# Patient Record
Sex: Female | Born: 1945 | Race: White | Hispanic: No | Smoking: Never smoker
Health system: Southern US, Community
[De-identification: ages and names within clinical notes are randomized; demographics above are authoritative.]

## PROBLEM LIST (undated history)

## (undated) DIAGNOSIS — K589 Irritable bowel syndrome without diarrhea: Secondary | ICD-10-CM

## (undated) DIAGNOSIS — R209 Unspecified disturbances of skin sensation: Principal | ICD-10-CM

## (undated) DIAGNOSIS — F32A Depression, unspecified: Secondary | ICD-10-CM

## (undated) DIAGNOSIS — G5600 Carpal tunnel syndrome, unspecified upper limb: Secondary | ICD-10-CM

## (undated) DIAGNOSIS — F419 Anxiety disorder, unspecified: Secondary | ICD-10-CM

## (undated) DIAGNOSIS — F329 Major depressive disorder, single episode, unspecified: Secondary | ICD-10-CM

## (undated) DIAGNOSIS — E7800 Pure hypercholesterolemia, unspecified: Secondary | ICD-10-CM

## (undated) DIAGNOSIS — G4700 Insomnia, unspecified: Secondary | ICD-10-CM

## (undated) HISTORY — DX: Major depressive disorder, single episode, unspecified: F32.9

## (undated) HISTORY — DX: Depression, unspecified: F32.A

## (undated) HISTORY — DX: Unspecified disturbances of skin sensation: R20.9

## (undated) HISTORY — DX: Irritable bowel syndrome, unspecified: K58.9

## (undated) HISTORY — DX: Insomnia, unspecified: G47.00

## (undated) HISTORY — DX: Pure hypercholesterolemia, unspecified: E78.00

## (undated) HISTORY — DX: Carpal tunnel syndrome, unspecified upper limb: G56.00

## (undated) HISTORY — PX: CARPAL TUNNEL RELEASE: SHX101

## (undated) HISTORY — DX: Anxiety disorder, unspecified: F41.9

---

## 1972-11-13 HISTORY — PX: TUBAL LIGATION: SHX77

## 2000-04-06 ENCOUNTER — Ambulatory Visit (HOSPITAL_COMMUNITY): Admission: RE | Admit: 2000-04-06 | Discharge: 2000-04-06 | Payer: Self-pay | Admitting: Gastroenterology

## 2002-06-02 ENCOUNTER — Encounter: Admission: RE | Admit: 2002-06-02 | Discharge: 2002-06-02 | Payer: Self-pay | Admitting: Family Medicine

## 2002-06-02 ENCOUNTER — Encounter: Payer: Self-pay | Admitting: Family Medicine

## 2003-12-11 ENCOUNTER — Ambulatory Visit (HOSPITAL_COMMUNITY): Admission: RE | Admit: 2003-12-11 | Discharge: 2003-12-11 | Payer: Self-pay | Admitting: *Deleted

## 2003-12-11 ENCOUNTER — Ambulatory Visit (HOSPITAL_BASED_OUTPATIENT_CLINIC_OR_DEPARTMENT_OTHER): Admission: RE | Admit: 2003-12-11 | Discharge: 2003-12-11 | Payer: Self-pay | Admitting: *Deleted

## 2005-02-21 ENCOUNTER — Other Ambulatory Visit: Admission: RE | Admit: 2005-02-21 | Discharge: 2005-02-21 | Payer: Self-pay | Admitting: Family Medicine

## 2006-08-31 ENCOUNTER — Other Ambulatory Visit: Admission: RE | Admit: 2006-08-31 | Discharge: 2006-08-31 | Payer: Self-pay | Admitting: Family Medicine

## 2009-04-09 ENCOUNTER — Other Ambulatory Visit: Admission: RE | Admit: 2009-04-09 | Discharge: 2009-04-09 | Payer: Self-pay | Admitting: Family Medicine

## 2011-03-31 NOTE — Procedures (Signed)
[place]. [facility]  Patient:    Courtney House, Courtney House                        MRN: [id number] Proc. Date: 04/06/00 Adm. Date:  [id number] Disc. Date: [id number] Attending:  Louie Bun CC:         Gretta Arab Valentina Lucks, M.D.                           Procedure Report  INDICATION FOR PROCEDURE:  Family history of colon cancer in her first degree family relative.  DESCRIPTION OF PROCEDURE:  The patient was placed in the left lateral decubitus position and placed on the pulse monitor with continuous low-flow oxygen delivered by nasal cannula.  She was sedated with 80 mg IV Demerol and 6 mg IV Versed.  The Olympus video colonoscope was inserted into the rectum and advanced to the cecum, confirmed by transillumination of McBurneys point and visualization of the ileocecal valve and appendiceal orifice.  The prep was excellent.  The cecum, ascending, transverse, descending, and sigmoid colon all appeared normal with no masses, polyps, diverticula, or other mucosal abnormalities.  The rectum likewise appeared normal in retroflexed view.  The anus did reveal some small internal hemorrhoids.  The colonoscope was then withdrawn and the patient returned to the recovery room in stable condition.  She tolerated the procedure well and there were no immediate complications.  IMPRESSION:  Internal hemorrhoids, otherwise normal colonoscopy.  PLAN:  Repeat colonoscopy in five years based on her family history. DD:  02/05/00 TD:  04/11/00 Job: [id number]

## 2011-03-31 NOTE — Op Note (Signed)
Courtney House, Courtney House                           ACCOUNT NO.:  [id number]   MEDICAL RECORD NO.:  [id number]                   PATIENT TYPE:  AMB   LOCATION:  DSC                                  FACILITY:  [facility]   PHYSICIAN:  Lowell Bouton, M.D.      DATE OF BIRTH:  1946-05-03   DATE OF PROCEDURE:  12/11/2003  DATE OF DISCHARGE:                                 OPERATIVE REPORT   PREOPERATIVE DIAGNOSIS:  Right carpal tunnel syndrome.   POSTOPERATIVE DIAGNOSIS:  Right carpal tunnel syndrome.   PROCEDURE:  Decompression median nerve right carpal tunnel.   SURGEON:  Lowell Bouton, M.D.   ANESTHESIA:  0.5% Marcaine local with sedation.   OPERATIVE FINDINGS:  The patient had a very narrow carpal canal.  The motor  branch of the nerve was intact and there were no masses present.   PROCEDURE:  Under 0.5% Marcaine local anesthesia with a tourniquet on the  right arm, the right hand was prepped and draped in the usual fashion.  After exsanguinating the limb, the tourniquet was inflated to 250 mmHg.  A 3  cm longitudinal incision was made in the palm just ulnar to the thenar  crease.  Sharp dissection was carried to the subcutaneous tissues.  Blunt  dissection was carried through the superficial palmar fascia distal to the  transverse carpal ligament.  A hemostat was placed in the carpal canal up  against the hook of the hamate and the transverse carpal ligament was  divided along the ulnar border of the median nerve.  The proximal end of the  ligament was divided with the scissors after dissecting the nerve away from  the under surface of the ligament.  The carpal canal was then palpated and  was found to be adequately decompressed.  The nerve was examined and the  motor branch was identified.  The wound was then irrigated with saline.  The  skin was closed with 4-0 nylon sutures.  Sterile dressings were applied  followed by a volar wrist splint.  The patient tolerated  the procedure well  and went to the recovery room awake, stable, in good condition.                                               Lowell Bouton, M.D.    EMM/MEDQ  D:  12/11/2003  T:  12/11/2003  Job:  [id number]   cc:   L. Lupe Carney, M.D.  [address] [place]  [place] [address]  Fax: [phone]

## 2013-07-15 ENCOUNTER — Other Ambulatory Visit: Payer: Self-pay

## 2013-07-15 DIAGNOSIS — Z1231 Encounter for screening mammogram for malignant neoplasm of breast: Secondary | ICD-10-CM

## 2013-08-04 ENCOUNTER — Ambulatory Visit: Payer: Self-pay

## 2013-10-02 ENCOUNTER — Other Ambulatory Visit: Payer: Self-pay | Admitting: Family Medicine

## 2013-10-02 ENCOUNTER — Other Ambulatory Visit (HOSPITAL_COMMUNITY)
Admission: RE | Admit: 2013-10-02 | Discharge: 2013-10-02 | Disposition: A | Discharge status: Home / Self Care | Payer: BC Managed Care – PPO | Source: Ambulatory Visit | Attending: Family Medicine | Admitting: Family Medicine

## 2013-10-02 DIAGNOSIS — Z124 Encounter for screening for malignant neoplasm of cervix: Secondary | ICD-10-CM | POA: Insufficient documentation

## 2013-10-23 ENCOUNTER — Other Ambulatory Visit: Payer: Self-pay | Admitting: Obstetrics & Gynecology

## 2014-05-04 ENCOUNTER — Encounter: Payer: Self-pay | Admitting: Neurology

## 2014-05-05 ENCOUNTER — Ambulatory Visit (INDEPENDENT_AMBULATORY_CARE_PROVIDER_SITE_OTHER): Payer: BC Managed Care – PPO | Admitting: Neurology

## 2014-05-05 ENCOUNTER — Encounter (INDEPENDENT_AMBULATORY_CARE_PROVIDER_SITE_OTHER): Payer: Self-pay

## 2014-05-05 ENCOUNTER — Encounter: Payer: Self-pay | Admitting: Neurology

## 2014-05-05 VITALS — BP 143/76 | HR 69 | Resp 16 | Ht 65.5 in | Wt 155.0 lb

## 2014-05-05 DIAGNOSIS — G609 Hereditary and idiopathic neuropathy, unspecified: Secondary | ICD-10-CM

## 2014-05-05 DIAGNOSIS — R209 Unspecified disturbances of skin sensation: Secondary | ICD-10-CM

## 2014-05-05 HISTORY — DX: Unspecified disturbances of skin sensation: R20.9

## 2014-05-05 MED ORDER — NONFORMULARY OR COMPOUNDED ITEM: Status: DC

## 2014-05-05 MED ORDER — GABAPENTIN 300 MG PO CAPS: 300.0000 mg | ORAL_CAPSULE | Freq: Every day | ORAL | Status: DC

## 2014-05-05 NOTE — Patient Instructions (Signed)
Peripheral Neuropathy Peripheral neuropathy is a type of nerve damage. It affects nerves that carry signals between the spinal cord and other parts of the body. These are called peripheral nerves. With peripheral neuropathy, one nerve or a group of nerves may be damaged.  CAUSES  Many things can damage peripheral nerves. For some people with peripheral neuropathy, the cause is unknown. Some causes include:  Diabetes. This is the most common cause of peripheral neuropathy.  Injury to a nerve.  Pressure or stress on a nerve that lasts a long time.  Too little vitamin B. Alcoholism can lead to this.  Infections.  Autoimmune diseases, such as multiple sclerosis and systemic lupus erythematosus.  Inherited nerve diseases.  Some medicines, such as cancer drugs.  Toxic substances, such as lead and mercury.  Too little blood flowing to the legs.  Kidney disease.  Thyroid disease. SIGNS AND SYMPTOMS  Different people have different symptoms. The symptoms you have will depend on which of your nerves is damaged. Common symptoms include:  Loss of feeling (numbness) in the feet and hands.  Tingling in the feet and hands.  Pain that burns.  Very sensitive skin.  Weakness.  Not being able to move a part of the body (paralysis).  Muscle twitching.  Clumsiness or poor coordination.  Loss of balance.  Not being able to control your bladder.  Feeling dizzy.  Sexual problems. DIAGNOSIS  Peripheral neuropathy is a symptom, not a disease. Finding the cause of peripheral neuropathy can be hard. To figure that out, your health care  will take a medical history and do a physical exam. A neurological exam will also be done. This involves checking things affected by your brain, spinal cord, and nerves (nervous system). For example, your health care  will check your reflexes, how you move, and what you can feel.  Other types of tests may also be ordered, such as:  Blood  tests.  A test of the fluid in your spinal cord.  Imaging tests, such as CT scans or an MRI.  Electromyography (EMG). This test checks the nerves that control muscles.  Nerve conduction velocity tests. These tests check how fast messages pass through your nerves.  Nerve biopsy. A small piece of nerve is removed. It is then checked under a microscope. TREATMENT   Medicine is often used to treat peripheral neuropathy. Medicines may include:  Pain-relieving medicines. Prescription or over-the-counter medicine may be suggested.  Antiseizure medicine. This may be used for pain.  Antidepressants. These also may help ease pain from neuropathy.  Lidocaine. This is a numbing medicine. You might wear a patch or be given a shot.  Mexiletine. This medicine is typically used to help control irregular heart rhythms.  Surgery. Surgery may be needed to relieve pressure on a nerve or to destroy a nerve that is causing pain.  Physical therapy to help movement.  Assistive devices to help movement. HOME CARE INSTRUCTIONS   Only take over-the-counter or prescription medicines as directed by your health care . Follow the instructions carefully for any given medicines. Do not take any other medicines without first getting approval from your health care .  If you have diabetes, work closely with your health care  to keep your blood sugar under control.  If you have numbness in your feet:  Check every day for signs of injury or infection. Watch for redness, warmth, and swelling.  Wear padded socks and comfortable shoes. These help protect your feet.  Do not do   things that put pressure on your damaged nerve.  Do not smoke. Smoking keeps blood from getting to damaged nerves.  Avoid or limit alcohol. Too much alcohol can cause a lack of B vitamins. These vitamins are needed for healthy nerves.  Develop a good support system. Coping with peripheral neuropathy can be  stressful. Talk to a mental health specialist or join a support group if you are struggling.  Follow up with your health care  as directed. SEEK MEDICAL CARE IF:   You have new signs or symptoms of peripheral neuropathy.  You are struggling emotionally from dealing with peripheral neuropathy.  You have a fever. SEEK IMMEDIATE MEDICAL CARE IF:   You have an injury or infection that is not healing.  You feel very dizzy or begin vomiting.  You have chest pain.  You have trouble breathing. Document Released: 10/20/2002 Document Revised: 07/12/2011 Document Reviewed: 07/07/2013 ExitCare Patient Information 2015 [employer]. This information is not intended to replace advice given to you by your health care . Make sure you discuss any questions you have with your health care .  

## 2014-05-05 NOTE — Progress Notes (Signed)
[facility]  :  Courtney Novasarmen  House, M D  Referring : Courtney House, Dean, MD Primary Care Physician:  Courtney House,DEAN, MD  Chief Complaint  Patient presents with  . New Evaluation    Room 11  . Leg pain and burning    HPI:  Courtney House is a 68 y.o. caucasian, right handed female , who is seen here as a referral from Dr. Lupe Carneyean Mitchell for abnormal sensations in her legs.   Dear Dr. August Saucerean,  Thank you for referring your patient Ms. Nelda SevereMary House to me today.  Her primary concern  as outlined in your referral note is a painful , ongoing , abnormal sensation in both legs-  but mostly in her left lower extremity.  She states that she has a sense of burning and stinging , aggravated by periods of prolonged standing. She also endorsed a creepy crawly sensation under the skin. She endorsed that she feels that the dysesthesias are coming from deep in size and not just from the skin layer itself. She works 11 hours daily she reports 6 days a week in standing position. She is in manufactoring, assembly line work , Journalist, newspaperbuilding  gas pumps.  The symptoms started some a couple of years ago, did not respond to OTC NSAIDS , such as ibuprofen.  Now progressing to more frequent times and with the same intensity as 3 years ago. She has not found relief. On days when she doesn't work , her symptoms begin as soon as she leaves the bed and begins ambulating. The symptoms are not nearly as strong as on work days.  There is no associated edema ,no cyanosis, no cramps - but loots of broken veins in the lower extremities.  She has no known metabolic disorder (I reviewed the labs form 2012 through 2014).  Dr Clovis RileyMitchell treats this patient with Tramadol, Temazepam and Lorazepam- these medications will not be prescribed by [facility].          Review of Systems: Out of a complete 14 system review, the patient complains of only the following symptoms, and all other reviewed systems are  negative. Burning, stinging pain in lower extremities, bilaterally but mostly on the left;   History   Social History  . Marital Status: Widowed    Spouse Name: N/A    Number of Children: 2  . Years of Education: 12   Occupational History  . Not on file.   Social History Main Topics  . Smoking status: Never Smoker   . Smokeless tobacco: Never Used  . Alcohol Use: No  . Drug Use: No  . Sexual Activity: Not on file   Other Topics Concern  . Not on file   Social History Narrative   Patient is widowed and her daughter and grandson lives with her.   Patient does drink one glass of tea daily.   Patient works on an  Theatre stage managerassembly line.   Patient has a high school education.   Patient has two adult children.   Patient is right-handed.                      Family History  Problem Relation Age of Onset  . Breast cancer Sister     lymph nodes  . Parkinson's disease Brother   . Throat cancer Brother   . Macular degeneration Brother     Past Medical History  Diagnosis Date  . IBS (irritable bowel syndrome)   . Insomnia   .  Hypercholesterolemia   . Carpal tunnel syndrome   . Anxiety   . Depression   . Sensory disease or syndrome 05/05/2014    Past Surgical History  Procedure Laterality Date  . Tubal ligation  1974  . Carpal tunnel release Right     Current Outpatient Prescriptions  Medication Sig Dispense Refill  . diclofenac (VOLTAREN) 75 MG EC tablet 1 tablet 2 (two) times daily.      . hyoscyamine (LEVSIN, ANASPAZ) 0.125 MG tablet 1 tablet. Every four hours as needed for abdominal pain.      Marland Kitchen. LORazepam (ATIVAN) 0.5 MG tablet Take by mouth. 1/2 -1 tablet twice a day as needed for anxiety      . temazepam (RESTORIL) 15 MG capsule Take 15 mg by mouth at bedtime as needed for sleep.      . TraMADol HCl (RYBIX ODT) 50 MG TBDP 1 tablet. Every 4 hours as needed for abdominal pain       No current facility-administered medications for this visit.    Allergies as of  05/05/2014  . (Not on File)    Vitals: BP 143/76  Pulse 69  Resp 16  Ht 5' 5.5" (1.664 m)  Wt 155 lb (70.308 kg)  BMI 25.39 kg/m2 Last Weight:  Wt Readings from Last 1 Encounters:  05/05/14 155 lb (70.308 kg)   Last Height:   Ht Readings from Last 1 Encounters:  05/05/14 5' 5.5" (1.664 m)    Physical exam:  General: The patient is awake, alert and appears not in acute distress. The patient is well groomed. Head: Normocephalic, atraumatic. Neck is supple. Mallampati 3, neck circumference:15  Cardiovascular:  Regular rate and rhythm , without  murmurs or carotid bruit, and without distended neck veins. Respiratory: Lungs are clear to auscultation. Skin:  Without evidence of edema, or rash, there is evidence of dystrophic skin over the lower extremities, the patient has no longer hair growth on the leg, has shiny and paleish skin with multiple  burst vessels.  Trunk: BMI is  elevated and patient  has normal posture.  Neurologic exam : The patient is awake and alert, oriented to place and time.  Memory subjective  described as intact. There is a normal attention span & concentration ability.  Speech is fluent without   dysarthria, dysphonia or aphasia. Mood and affect are appropriate.  Cranial nerves: Pupils are equal and briskly reactive to light. Funduscopic exam without  evidence of pallor or edema. Extraocular movements in vertical and horizontal planes intact and without nystagmus. Visual fields by finger perimetry are intact. Hearing to finger rub intact.  Facial sensation intact to fine touch. Facial motor strength is symmetric and tongue and uvula move midline.  Motor exam:    Normal tone and  muscle bulk and symmetric normal strength in all extremities. She has no pain with straight leg rise, no radicula-opathy  Sensory:  Fine touch, pinprick and vibration were tested in all extremities, no loss of vibration, temperature or fine filamen touch. Proprioception  in the upper  extremities  normal.  Coordination: Rapid alternating movements in the fingers/hands is tested and normal. Finger-to-nose maneuver tested and normal without evidence of ataxia, dysmetria or tremor.  Gait and station: Patient walks without assistive device . She has flat feet.  Strength within normal limits. Stance is stable and normal. Tandem gait is unaffected, as is Romberg test.   Deep tendon reflexes: in the  upper and lower extremities are attenuated. Babinski maneuver response is sluggish,  Downgoing, upon repeat testing. .   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  Dystrophy of the skin area. Loss of sweat glands and hair roots  Plan:  Treatment plan and additional workup : Skin biopsy referral to Dr. Terrace Arabia.And arrange for Dr. Terrace Arabia to decide if an evaluation by NCS /EMG .  this patient has no thyroid disease , no diabetes and no other endocrine disorder.  I would like for her to use a topical pain cream rather than tramadol, write for 6a by [facility]der[facility].  Insoles with foam / gel cushion to get better pain prevention.

## 2014-05-11 ENCOUNTER — Telehealth: Payer: Self-pay

## 2014-05-11 NOTE — Telephone Encounter (Signed)
Called patient and left her a message to call me back and schedule Skin Biopsy with Dr.Yan.

## 2014-05-13 ENCOUNTER — Telehealth: Payer: Self-pay

## 2014-05-13 MED ORDER — GABAPENTIN 100 MG PO CAPS: 100.0000 mg | ORAL_CAPSULE | Freq: Every day | ORAL | Status: DC

## 2014-05-13 NOTE — Telephone Encounter (Signed)
Yes, she can use 100 to 300 mg as needed.

## 2014-05-13 NOTE — Telephone Encounter (Signed)
[facility] sent us a fax saying the patient feels like Gabapentin has helped with her pain, but she is very groggy in the morning.  She would like to know if the Rx could be changed from 300mg  capsules to 100mg  capsules with directions of take 1-3 caps po as needed.  Okay to change Rx?  Please advise.  Thank you.

## 2014-05-18 NOTE — Telephone Encounter (Signed)
Called patient and she is coming for skin biopsy with Dr.Yan 05/19/2014. Dr.Yan had a cx. appt.

## 2014-05-18 NOTE — Telephone Encounter (Signed)
Patient returning call to Arma HeadingDana C, please call patient and advise.

## 2014-05-19 ENCOUNTER — Ambulatory Visit (INDEPENDENT_AMBULATORY_CARE_PROVIDER_SITE_OTHER): Payer: Self-pay | Admitting: Neurology

## 2014-05-19 ENCOUNTER — Encounter: Payer: Self-pay | Admitting: Neurology

## 2014-05-19 ENCOUNTER — Ambulatory Visit (INDEPENDENT_AMBULATORY_CARE_PROVIDER_SITE_OTHER): Payer: BC Managed Care – PPO | Admitting: Neurology

## 2014-05-19 ENCOUNTER — Encounter (INDEPENDENT_AMBULATORY_CARE_PROVIDER_SITE_OTHER): Payer: Self-pay

## 2014-05-19 DIAGNOSIS — Z0289 Encounter for other administrative examinations: Secondary | ICD-10-CM

## 2014-05-19 DIAGNOSIS — R200 Anesthesia of skin: Secondary | ICD-10-CM

## 2014-05-19 DIAGNOSIS — R209 Unspecified disturbances of skin sensation: Secondary | ICD-10-CM

## 2014-05-20 NOTE — Procedures (Signed)
   NCS (NERVE CONDUCTION STUDY) WITH EMG (ELECTROMYOGRAPHY) REPORT   STUDY DATE: July 7th 2015 PATIENT NAME: Courtney House DOB: 12/27/1945 MRN: [id number]    TECHNOLOGIST: Gearldine ShownLorraine Jones ELECTROMYOGRAPHER: Levert FeinsteinYan,  M.D.  CLINICAL INFORMATION:   68 years old female, complains few years history of left leg, foot paresthesia, deep achy pain.  FINDINGS: NERVE CONDUCTION STUDY: Bilateral peroneal sensory responses were normal. Bilateral tibial motor responses were normal. Bilateral tibial H. reflexes were normal and symmetric. Right peroneal motor responses were normal. Left peroneal motor response showed mildly decreased C. map amplitude, with normal distal latency, conduction velocity, and F waves latency.   Bilateral ulnar sensory and motor responses were normal.  Bilateral median sensory response showed prolonged peak latency, left side this severe, right side is moderate with preserved snap amplitude.  Bilateral median motor responses showed prolonged distal latency, F wave latency, left side is moderate, right side is mild, with normal C. map amplitude, conduction velocity    NEEDLE ELECTROMYOGRAPHY: Selected needle examination was performed at left lower extremity muscles, and left lumbosacral paraspinals.  Needle examination of left tibialis anterior, tibialis posterior, medial gastrocnemius, peroneal longus, vastus lateralis, biceps femoris long head, gluteus medius was normal  There is no spontaneous activity at left lumbosacral paraspinal muscles, left L4-5 S1  IMPRESSION:   This is an abnormal study. There is electrodiagnostic evidence of moderate bilateral median neuropathy across the wrist, consistent with moderate bilateral carpal tunnel syndromes. Left side worse than the right.  There was no evidence of left lumbar radiculopathy.   INTERPRETING PHYSICIAN:   Levert FeinsteinYan,  M.D. Ph.D. [facility] [address] [place], [place] [address] [phone]  [phone]

## 2014-05-20 NOTE — Procedures (Signed)
68 years old female, complains few years history of left leg numbness, achy discomfort.  Patient was in right lateral recombinant position. Sterile technique. 1% lidocaine with epinephrine was used for local anesthesia. Punctuated skin biopsy was performed. 3 mm skin sample were obtained at at left foot, above left extensor digitorum brevis, and left lateral calf, 10 cm above lateral malleolus. 20 cm below left superior iliac spine.  Patient tolerated the procedure well.  The wound was covered with neosporin antibiotic cream and bandage.

## 2014-05-22 NOTE — Progress Notes (Signed)
error 

## 2014-05-27 ENCOUNTER — Telehealth: Payer: Self-pay

## 2014-05-27 NOTE — Telephone Encounter (Signed)
Results for skin  Biopsy in your box.

## 2014-05-27 NOTE — Telephone Encounter (Signed)
I tried and failed to reach patient  Please call patient, skin biopsy showed no evidence of peripheral neuropathy, despite the reported reduced fiber density at left thigh, which likely due to technique variant.

## 2014-05-27 NOTE — Telephone Encounter (Signed)
Called pt and left message for pt to call back.

## 2014-05-28 NOTE — Telephone Encounter (Signed)
Pt called back and I informed pt per Dr. Terrace ArabiaYan that the pt's skin biopsy results showed no evidence of peripheral neuropathy, despite the reported reduced fiber density at left thigh, which likely due to technique variant. I advised the pt the if she has any other problems, questions or concerns to call the office. Pt verbalized understanding.

## 2014-06-22 ENCOUNTER — Encounter: Payer: Self-pay | Admitting: Neurology

## 2014-06-26 ENCOUNTER — Ambulatory Visit (INDEPENDENT_AMBULATORY_CARE_PROVIDER_SITE_OTHER): Payer: BC Managed Care – PPO | Admitting: Adult Health

## 2014-06-26 ENCOUNTER — Encounter: Payer: Self-pay | Admitting: Adult Health

## 2014-06-26 VITALS — BP 145/83 | HR 83 | Ht 65.5 in | Wt 155.0 lb

## 2014-06-26 DIAGNOSIS — R209 Unspecified disturbances of skin sensation: Secondary | ICD-10-CM

## 2014-06-26 NOTE — Progress Notes (Signed)
I agree with the assessment and plan as directed by NP .The patient is known to me .   ,, MD  

## 2014-06-26 NOTE — Patient Instructions (Signed)

## 2014-06-26 NOTE — Progress Notes (Signed)
PATIENT: Courtney SaversMary H House DOB: 03/15/1946  REASON FOR VISIT: follow up HISTORY FROM: patient  HISTORY OF PRESENT ILLNESS: Courtney House is a 68 year old female with a history of peripheral neuropathy. She returns today for follow-up. She had skin biopsy that did not show a small fiber neuropathy. She was using a compound cream but states that it made the discomfort worse. She also uses gabapentin at night and reports that it has helped a lot. Patient states that she has burning and tingling in the lower legs but not in the feet and more on the left than right. Denies numbness.Denies any issue with ambulation and balance. Denies any falls.  She had nerve conduction studies with EMG that show bilateral carpal tunnel syndrome. The patient denies having any blood work done except with her PCP.   HISTORY 05/05/14 (CD):  Her primary concern as outlined in your referral note is a painful , ongoing , abnormal sensation in both legs- but mostly in her left lower extremity.  She states that she has a sense of burning and stinging , aggravated by periods of prolonged standing. She also endorsed a creepy crawly sensation under the skin. She endorsed that she feels that the dysesthesias are coming from deep in size and not just from the skin layer itself. She works 11 hours daily she reports 6 days a week in standing position. She is in Set designermanufacturing, assembly line work , Chemical engineerbuilding gas pumps.  The symptoms started some a couple of years ago, did not respond to OTC NSAIDS , such as ibuprofen.  Now progressing to more frequent times and with the same intensity as 3 years ago. She has not found relief. On days when she doesn't work , her symptoms begin as soon as she leaves the bed and begins ambulating. The symptoms are not nearly as strong as on work days.  There is no associated edema ,no cyanosis, no cramps - but loots of broken veins in the lower extremities.  She has no known metabolic disorder (I reviewed the  labs form 2012 through 2014).  Dr Clovis RileyMitchell treats this patient with Tramadol, Temazepam and Lorazepam- these medications will not be prescribed by [facility].    REVIEW OF SYSTEMS: Full 14 system review of systems performed and notable only for:  Constitutional: N/A  Eyes: N/A Ear/Nose/Throat: N/A  Skin: N/A  Cardiovascular: N/A  Respiratory: N/A  Gastrointestinal: N/A  Genitourinary: N/A Hematology/Lymphatic: N/A  Endocrine: N/A Musculoskeletal:N/A  Allergy/Immunology: N/A  Neurological: N/A Psychiatric: N/A Sleep: N/A   ALLERGIES: No Known Allergies  HOME MEDICATIONS: Outpatient Prescriptions Prior to Visit  Medication Sig Dispense Refill  . diclofenac (VOLTAREN) 75 MG EC tablet 1 tablet 2 (two) times daily.      Marland Kitchen. gabapentin (NEURONTIN) 100 MG capsule Take 1 capsule (100 mg total) by mouth at bedtime. Take between 1- 3 caps at night po.  90 capsule  3  . hyoscyamine (LEVSIN, ANASPAZ) 0.125 MG tablet 1 tablet. Every four hours as needed for abdominal pain.      Marland Kitchen. LORazepam (ATIVAN) 0.5 MG tablet Take by mouth. 1/2 -1 tablet twice a day as needed for anxiety      . temazepam (RESTORIL) 15 MG capsule Take 15 mg by mouth at bedtime as needed for sleep.      . TraMADol HCl (RYBIX ODT) 50 MG TBDP 1 tablet. Every 4 hours as needed for abdominal pain      . Gabapentin POWD       .  NONFORMULARY OR COMPOUNDED ITEM Transdermal therapeutics - 6 A ointment for pain in both feet.  100 each  1   No facility-administered medications prior to visit.    PAST MEDICAL HISTORY: Past Medical History  Diagnosis Date  . IBS (irritable bowel syndrome)   . Insomnia   . Hypercholesterolemia   . Carpal tunnel syndrome   . Anxiety   . Depression   . Sensory disease or syndrome 05/05/2014    PAST SURGICAL HISTORY: Past Surgical History  Procedure Laterality Date  . Tubal ligation  1974  . Carpal tunnel release Right     FAMILY HISTORY: Family History  Problem Relation Age of Onset  .  Breast cancer Sister     lymph nodes  . Parkinson's disease Brother   . Throat cancer Brother   . Macular degeneration Brother     SOCIAL HISTORY: History   Social History  . Marital Status: Widowed    Spouse Name: N/A    Number of Children: 2  . Years of Education: 12   Occupational History  . Not on file.   Social History Main Topics  . Smoking status: Never Smoker   . Smokeless tobacco: Never Used  . Alcohol Use: No  . Drug Use: No  . Sexual Activity: Not on file   Other Topics Concern  . Not on file   Social History Narrative   Patient is widowed and her daughter and grandson lives with her.   Patient does drink one glass of tea daily.   Patient works on an  Theatre stage manager.   Patient has a high school education.   Patient has two adult children.   Patient is right-handed.                        PHYSICAL EXAM  Filed Vitals:   06/26/14 1011  BP: 145/83  Pulse: 83  Height: 5' 5.5" (1.664 m)  Weight: 155 lb (70.308 kg)   Body mass index is 25.39 kg/(m^2).  Generalized: Well developed, in no acute distress    Neurological examination  Mentation: Alert oriented to time, place, history taking. Follows all commands speech and language fluent Cranial nerve II-XII: Pupils were equal round reactive to light extraocular movements were full, visual field were full on confrontational test. Facial sensation and strength were normal. hearing was intact to finger rubbing bilaterally. Uvula tongue midline. Head turning and shoulder shrug  were normal and symmetric. Motor: The motor testing reveals 5 over 5 strength of all 4 extremities. Good symmetric motor tone is noted throughout.  Sensory: Sensory testing is intact to pinprick, soft touch, vibration sensation, and position sense on all 4 extremities. No evidence of extinction is noted. Patient does have certain areas on the left lower leg where pinprick felt dull compared to the foot.  Coordination: Cerebellar  testing reveals good finger-nose-finger and heel-to-shin bilaterally.  Gait and station: Gait is normal. Tandem gait is normal. Romberg is negative. No drift is seen.  Reflexes: Deep tendon reflexes are symmetric and normal bilaterally.   DIAGNOSTIC DATA (LABS, IMAGING, TESTING) - I reviewed patient records, labs, notes, testing and imaging myself where available.    ASSESSMENT AND PLAN 68 y.o. year old female  has a past medical history of IBS (irritable bowel syndrome); Insomnia; Hypercholesterolemia; Carpal tunnel syndrome; Anxiety; Depression; and Sensory disease or syndrome (05/05/2014). here with:  1. Burning and tingling in lower extremities.   On exam patient does not have  a clear presentation of a peripheral neuropathy. She has had  nerve conduction studies with EMG that only showed a bilateral carpal tunnel syndrome. She has also had a skin biopsy that did not confirm a small fiber neuropathy. She states that she has  burning and tingling but denies numbness in the lower extremities limited to the lower leg but not the foot. She states that the burning and tingling occur more in the left leg when compared to the right. I will check some blood work today looking for other etiologies of her sensory change. She does state that the gabapentin taking 300 mg at night has been very beneficial for her neuropathic pain. She should continue taking this. Patient should followup in 4 months or sooner if needed   Butch Penny, MSN, NP-C 06/26/2014, 10:15 AM [facility] [address] [place], [place] [address] [phone]  Note: This document was prepared with digital dictation and possible smart phrase technology. Any transcriptional errors that result from this process are unintentional.

## 2014-06-30 ENCOUNTER — Telehealth: Payer: Self-pay | Admitting: Adult Health

## 2014-06-30 LAB — IFE AND PE, SERUM
ALBUMIN/GLOB SERPL: 1.3 (ref 0.7–2.0)
Albumin SerPl Elph-Mcnc: 3.6 g/dL (ref 3.2–5.6)
Alpha 1: 0.2 g/dL (ref 0.1–0.4)
Alpha2 Glob SerPl Elph-Mcnc: 0.7 g/dL (ref 0.4–1.2)
B-Globulin SerPl Elph-Mcnc: 0.9 g/dL (ref 0.6–1.3)
GAMMA GLOB SERPL ELPH-MCNC: 1.1 g/dL (ref 0.5–1.6)
GLOBULIN, TOTAL: 2.8 g/dL (ref 2.0–4.5)
IGA/IMMUNOGLOBULIN A, SERUM: 167 mg/dL (ref 91–414)
IGG (IMMUNOGLOBIN G), SERUM: 1000 mg/dL (ref 700–1600)
IgM (Immunoglobulin M), Srm: 171 mg/dL (ref 40–230)
Total Protein: 6.4 g/dL (ref 6.0–8.5)

## 2014-06-30 LAB — SEDIMENTATION RATE: Sed Rate: 7 mm/hr (ref 0–40)

## 2014-06-30 LAB — RHEUMATOID FACTOR: Rhuematoid fact SerPl-aCnc: <7 IU/mL (ref 0.0–13.9)

## 2014-06-30 LAB — ANA W/REFLEX: ANA: NEGATIVE

## 2014-06-30 LAB — ANGIOTENSIN CONVERTING ENZYME: Angio Convert Enzyme: 50 U/L (ref 14–82)

## 2014-06-30 LAB — VITAMIN B12: Vitamin B-12: 400 pg/mL (ref 211–946)

## 2014-06-30 LAB — LYME, TOTAL AB TEST/REFLEX: Lyme IgG/IgM Ab: <0.91 {ISR} (ref 0.00–0.90)

## 2014-06-30 LAB — TSH: TSH: 0.593 u[IU]/mL (ref 0.450–4.500)

## 2014-06-30 LAB — VITAMIN D 25 HYDROXY (VIT D DEFICIENCY, FRACTURES): Vit D, 25-Hydroxy: 23.3 ng/mL — ABNORMAL LOW (ref 30.0–100.0)

## 2014-06-30 NOTE — Telephone Encounter (Signed)
I called the patient regarding lab results. I left a voicemail and requested that she call the office back.

## 2014-06-30 NOTE — Telephone Encounter (Signed)
I tried to call the patient with lab work. No answer.

## 2014-07-01 ENCOUNTER — Telehealth: Payer: Self-pay | Admitting: Adult Health

## 2014-07-01 NOTE — Telephone Encounter (Signed)
I called the patient again in regard to her lab results. I left a voice mail for her to call our office back.

## 2014-07-02 ENCOUNTER — Encounter: Payer: Self-pay | Admitting: Adult Health

## 2014-07-02 ENCOUNTER — Telehealth: Payer: Self-pay | Admitting: Adult Health

## 2014-07-02 NOTE — Telephone Encounter (Signed)
I called the patient and left a message. I will send her a letter regarding her lab work and plan of care.

## 2014-07-03 ENCOUNTER — Encounter: Payer: Self-pay | Admitting: Adult Health

## 2014-07-03 ENCOUNTER — Telehealth: Payer: Self-pay | Admitting: Adult Health

## 2014-07-03 NOTE — Telephone Encounter (Signed)
error 

## 2014-07-10 ENCOUNTER — Telehealth: Payer: Self-pay | Admitting: Adult Health

## 2014-07-10 NOTE — Telephone Encounter (Signed)
Patient has questions regarding letter received with blood work results.  Please return call anytime and may leave message on vm.

## 2014-07-13 NOTE — Telephone Encounter (Signed)
Called patient back, no answer left message that her Vitamin D levels were low and she is to get OTC Vitamin D 5000 IU supplements, take daily for 8 weeks and levels will be re-checked then, any other questions or concerns to call the office back.

## 2014-08-24 ENCOUNTER — Telehealth: Payer: Self-pay | Admitting: Adult Health

## 2014-08-24 DIAGNOSIS — E559 Vitamin D deficiency, unspecified: Secondary | ICD-10-CM

## 2014-08-24 NOTE — Telephone Encounter (Signed)
I called the patient and left a message that I would like to recheck lab work. The patient needs to have her vitamin D level rechecked.

## 2014-09-10 ENCOUNTER — Other Ambulatory Visit (INDEPENDENT_AMBULATORY_CARE_PROVIDER_SITE_OTHER): Payer: Self-pay

## 2014-09-10 DIAGNOSIS — E559 Vitamin D deficiency, unspecified: Secondary | ICD-10-CM

## 2014-09-10 DIAGNOSIS — Z0289 Encounter for other administrative examinations: Secondary | ICD-10-CM

## 2014-09-11 ENCOUNTER — Other Ambulatory Visit: Payer: Self-pay | Admitting: Adult Health

## 2014-09-11 LAB — VITAMIN D 25 HYDROXY (VIT D DEFICIENCY, FRACTURES): Vit D, 25-Hydroxy: 47 ng/mL (ref 30.0–100.0)

## 2014-11-09 ENCOUNTER — Ambulatory Visit (INDEPENDENT_AMBULATORY_CARE_PROVIDER_SITE_OTHER): Payer: BC Managed Care – PPO | Admitting: Adult Health

## 2014-11-09 ENCOUNTER — Encounter: Payer: Self-pay | Admitting: Adult Health

## 2014-11-09 VITALS — BP 133/82 | HR 88 | Ht 66.0 in | Wt 148.0 lb

## 2014-11-09 DIAGNOSIS — R209 Unspecified disturbances of skin sensation: Secondary | ICD-10-CM

## 2014-11-09 DIAGNOSIS — R208 Other disturbances of skin sensation: Secondary | ICD-10-CM

## 2014-11-09 MED ORDER — GABAPENTIN 100 MG PO CAPS: ORAL_CAPSULE | ORAL | Status: DC

## 2014-11-09 NOTE — Progress Notes (Signed)
I agree with the assessment and plan as directed by NP .The patient is known to me .   ,, MD  

## 2014-11-09 NOTE — Patient Instructions (Signed)
Increase gabapentin to 1 tablet int he morning and 3 tablets at bedtime.  If this makes you drowsy during the day you can take 4 tablets at bedtime.  If your symptoms worsen or you develop new symptoms please let us know.

## 2014-11-09 NOTE — Progress Notes (Signed)
PATIENT: Courtney House DOB: April 24, 1946  REASON FOR VISIT: follow up HISTORY FROM: patient  HISTORY OF PRESENT ILLNESS: Courtney House is a 68 year old female with a history of peripheral neuropathy. She returns today for follow-up. She is currently taking gabapentin 300 mg at bedtime and tolerating it well however she states that it is not working as well as it was. She has numbness and tingling that are in the legs primarily. She states that the left is worse than the right. She states that it bothers her the most when she is up walking or standing. She states that it gets better with rest. She had blood work at the last visit that show a low Vitamin D. Since has been on supplementation and her level increased. Denies any back pain.    HISTORY 06/26/14: Courtney House is a 68 year old female with a history of peripheral neuropathy. She returns today for follow-up. She had skin biopsy that did not show a small fiber neuropathy. She was using a compound cream but states that it made the discomfort worse. She also uses gabapentin at night and reports that it has helped a lot. Patient states that she has burning and tingling in the lower legs but not in the feet and more on the left than right. Denies numbness.Denies any issue with ambulation and balance. Denies any falls.  She had nerve conduction studies with EMG that show bilateral carpal tunnel syndrome. The patient denies having any blood work done except with her PCP.   HISTORY 05/05/14 (CD): Her primary concern as outlined in your referral note is a painful , ongoing , abnormal sensation in both legs- but mostly in her left lower extremity.  She states that she has a sense of burning and stinging , aggravated by periods of prolonged standing. She also endorsed a creepy crawly sensation under the skin. She endorsed that she feels that the dysesthesias are coming from deep in size and not just from the skin layer itself. She works 11 hours daily she reports 6  days a week in standing position. She is in Set designer, assembly line work , Chemical engineer pumps.  The symptoms started some a couple of years ago, did not respond to OTC NSAIDS , such as ibuprofen.  Now progressing to more frequent times and with the same intensity as 3 years ago. She has not found relief. On days when she doesn't work , her symptoms begin as soon as she leaves the bed and begins ambulating. The symptoms are not nearly as strong as on work days. There is no associated edema ,no cyanosis, no cramps - but loots of broken veins in the lower extremities. She has no known metabolic disorder (I reviewed the labs form 2012 through 2014).  [name] treats this patient with Tramadol, Temazepam and Lorazepam- these medications will not be prescribed by [facility].  REVIEW OF SYSTEMS: Out of a complete 14 system review of symptoms, the patient complains only of the following symptoms, and all other reviewed systems are negative.  ALLERGIES: No Known Allergies  HOME MEDICATIONS: Outpatient Prescriptions Prior to Visit  Medication Sig Dispense Refill  . diclofenac (VOLTAREN) 75 MG EC tablet 1 tablet 2 (two) times daily.    Marland Kitchen gabapentin (NEURONTIN) 100 MG capsule Take 1 capsule (100 mg total) by mouth at bedtime. Take between 1- 3 caps at night po. (Patient taking differently: Take 300 mg by mouth at bedtime. Take between 1- 3 caps at night  po.) 90 capsule 3  . LORazepam (ATIVAN) 0.5 MG tablet Take by mouth. 1/2 -1 tablet twice a day as needed for anxiety    . temazepam (RESTORIL) 15 MG capsule Take 15 mg by mouth at bedtime as needed for sleep.    . TraMADol HCl (RYBIX ODT) 50 MG TBDP 1 tablet. Every 4 hours as needed for abdominal pain    . hyoscyamine (LEVSIN, ANASPAZ) 0.125 MG tablet 1 tablet. Every four hours as needed for abdominal pain.     No facility-administered medications prior to visit.    PAST MEDICAL HISTORY: Past Medical History  Diagnosis Date  . IBS (irritable bowel  syndrome)   . Insomnia   . Hypercholesterolemia   . Carpal tunnel syndrome   . Anxiety   . Depression   . Sensory disease or syndrome 05/05/2014    PAST SURGICAL HISTORY: Past Surgical History  Procedure Laterality Date  . Tubal ligation  1974  . Carpal tunnel release Right     FAMILY HISTORY: Family History  Problem Relation Age of Onset  . Breast cancer Sister     lymph nodes  . Parkinson's disease Brother   . Throat cancer Brother   . Macular degeneration Brother     SOCIAL HISTORY: History   Social History  . Marital Status: Widowed    Spouse Name: N/A    Number of Children: 2  . Years of Education: 12   Occupational History  . Not on file.   Social History Main Topics  . Smoking status: Never Smoker   . Smokeless tobacco: Never Used  . Alcohol Use: No  . Drug Use: No  . Sexual Activity: Not on file   Other Topics Concern  . Not on file   Social History Narrative   Patient is widowed and her daughter and grandson lives with her.   Patient does drink one glass of tea daily.   Patient works on an  Theatre stage managerassembly line.   Patient has a high school education.   Patient has two adult children.   Patient is right-handed.                        PHYSICAL EXAM  Filed Vitals:   11/09/14 1020  BP: 133/82  Pulse: 88  Height: 5\' 6"  (1.676 m)  Weight: 148 lb (67.132 kg)   Body mass index is 23.9 kg/(m^2).  Generalized: Well developed, in no acute distress   Neurological examination  Mentation: Alert oriented to time, place, history taking. Follows all commands speech and language fluent Cranial nerve II-XII: Pupils were equal round reactive to light. Extraocular movements were full, visual field were full on confrontational test. Facial sensation and strength were normal. Uvula tongue midline. Head turning and shoulder shrug  were normal and symmetric. Motor: The motor testing reveals 5 over 5 strength of all 4 extremities. Good symmetric motor tone is  noted throughout.  Sensory: Sensory testing is intact to soft touch on all 4 extremities. Pinprick decrease in stocking like pattern bilaterally however worse on the left. Decrease vibration sensation on the left. Position sense intact bilaterally. No evidence of extinction is noted.  Coordination: Cerebellar testing reveals good finger-nose-finger and heel-to-shin bilaterally.  Gait and station: Gait is normal. Tandem gait is normal. Romberg is negative. No drift is seen.  Reflexes: Deep tendon reflexes are symmetric and normal bilaterally.    DIAGNOSTIC DATA (LABS, IMAGING, TESTING) - I reviewed patient records, labs, notes, testing  and imaging myself where available.      Component Value Date/Time   PROT 6.4 06/26/2014 1054    Lab Results  Component Value Date   VITAMINB12 400 06/26/2014   Lab Results  Component Value Date   TSH 0.593 06/26/2014      ASSESSMENT AND PLAN 68 y.o. year old female  has a past medical history of IBS (irritable bowel syndrome); Insomnia; Hypercholesterolemia; Carpal tunnel syndrome; Anxiety; Depression; and Sensory disease or syndrome (05/05/2014). here with:  1. Burning and tingling in lower extremities left greater than right  The patient's discomfort was controlled with gabapentin 300 mg at bedtime. She states recently the pain has increased. The patient states that her symptoms normally occur with activity and improve with rest. I will increase her gabapentin to 100 mg in the a.m. and 300 mg in the PM. The patient has had nerve conduction studies with the EMG that only showed carpal tunnel syndrome. The patient denies any low back pain. In the future a lumbar MRI may be warranted as her symptoms are not completely consistent with peripheral neuropathy. If her symptoms worsen or she develops new symptoms she should let us know. Otherwise she will follow up in 4 months with Dr. Vickey Hugerohmeier.  Butch PennyMegan , MSN, NP-C 11/09/2014, 10:39 AM [facility] [address], [address] [place], [place] [id number] [phone]  Note: This document was prepared with digital dictation and possible smart phrase technology. Any transcriptional errors that result from this process are unintentional.

## 2015-03-02 ENCOUNTER — Other Ambulatory Visit: Payer: Self-pay | Admitting: Adult Health

## 2015-03-10 ENCOUNTER — Encounter: Payer: Self-pay | Admitting: Neurology

## 2015-03-10 ENCOUNTER — Ambulatory Visit (INDEPENDENT_AMBULATORY_CARE_PROVIDER_SITE_OTHER): Payer: BLUE CROSS/BLUE SHIELD | Admitting: Neurology

## 2015-03-10 VITALS — BP 138/80 | HR 76 | Resp 18 | Ht 66.0 in | Wt 144.0 lb

## 2015-03-10 DIAGNOSIS — R609 Edema, unspecified: Secondary | ICD-10-CM | POA: Diagnosis not present

## 2015-03-10 DIAGNOSIS — L942 Calcinosis cutis: Secondary | ICD-10-CM | POA: Diagnosis not present

## 2015-03-10 DIAGNOSIS — R600 Localized edema: Secondary | ICD-10-CM

## 2015-03-10 MED ORDER — GABAPENTIN 100 MG PO CAPS: ORAL_CAPSULE | ORAL | Status: DC

## 2015-03-10 MED ORDER — PRENATAL VITAMINS 28-0.8 MG PO TABS: 28.0000 mg | ORAL_TABLET | ORAL | Status: AC

## 2015-03-10 NOTE — Progress Notes (Signed)
PATIENT: Courtney House DOB: [birth year]  REASON FOR VISIT: follow up HISTORY FROM: patient  HISTORY OF PRESENT ILLNESS: Courtney House is a 69 year old female with a history of peripheral neuropathy. She returns today for follow-up. She is currently taking gabapentin 300 mg at bedtime and tolerating it well however she states that it is not working as well as it was. She will have puffiness around the ankle /.  She has numbness and tingling that are in the legs primarily.  She states that the left is still worse than the right. She states that it bothers her the most when she is up walking or standing, gets better with rest.  She had blood work at the last visit that show a low Vitamin D. Since has been on supplementation and her level increased. Denies any back pain.   She had nerve conduction studies with EMG that show bilateral carpal tunnel syndrome. The patient denies having any blood work done except with her PCP.   HISTORY 05/05/14 (CD): Her primary concern as outlined in your referral note is a painful , ongoing , abnormal sensation in both legs- but mostly in her left lower extremity.  She states that she has a sense of burning and stinging , aggravated by periods of prolonged standing. She also endorsed a creepy crawly sensation under the skin. She endorsed that she feels that the dysesthesias are coming from deep in size and not just from the skin layer itself. She works 11 hours daily she reports 6 days a week in standing position. She is in Set designer, assembly line work , Chemical engineer pumps.  The symptoms started some a couple of years ago, did not respond to OTC NSAIDS , such as ibuprofen.  Now progressing to more frequent times and with the same intensity as 3 years ago. She has not found relief. On days when she doesn't work , her symptoms begin as soon as she leaves the bed and begins ambulating. The symptoms are not nearly as strong as on work days. There is no associated edema ,no  cyanosis, no cramps - but loots of broken veins in the lower extremities. She has no known metabolic disorder (I reviewed the labs form 2012 through 2014).  [name] treats this patient with Tramadol, Temazepam and Lorazepam- these medications will not be prescribed by [facility].   03-10-15 The patient is still taking tramadol and in addition gabapentin as provided by [facility]. She does have shiny puffy skin the left more than the right but this definitely dystrophic skin changes noticed. She is not diabetic, has never been a smoker, and has no history of thyroid disease. 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 The geometric depression score was endorsed at one point. She had no falls as of this calendar year.  REVIEW OF SYSTEMS: Out of a complete 14 system review of symptoms, the patient complains only of the following symptoms, and all other reviewed systems are negative.  Edema in both feet left more than right, skin changes skin dystrophy depigmentation hyperpigmentation and scaling of the skin. Redness. Burst blood vessels.  ALLERGIES: No Known Allergies  HOME MEDICATIONS: Outpatient Prescriptions Prior to Visit  Medication Sig Dispense Refill  . calcium gluconate 650 MG tablet Take 650 mg by mouth daily.    . cholecalciferol (VITAMIN D) 1000 UNITS tablet Take 1,000 Units by mouth daily.    Marland Kitchen gabapentin (NEURONTIN) 100  MG capsule TAKE 1 CAPSULE IN THE MORNING, AND 3 CAPLETS AT BEDTIME. 120 capsule 0  . LORazepam (ATIVAN) 0.5 MG tablet Take by mouth. 1/2 -1 tablet twice a day as needed for anxiety    . temazepam (RESTORIL) 15 MG capsule Take 15 mg by mouth at bedtime as needed for sleep.    . TraMADol HCl (RYBIX ODT) 50 MG TBDP 1 tablet. Every 4 hours as needed for abdominal pain    . diclofenac (VOLTAREN) 75 MG EC tablet 1 tablet 2 (two) times daily.     No facility-administered medications prior to visit.    PAST MEDICAL  HISTORY: Past Medical History  Diagnosis Date  . IBS (irritable bowel syndrome)   . Insomnia   . Hypercholesterolemia   . Carpal tunnel syndrome   . Anxiety   . Depression   . Sensory disease or syndrome 05/05/2014    PAST SURGICAL HISTORY: Past Surgical History  Procedure Laterality Date  . Tubal ligation  1974  . Carpal tunnel release Right     FAMILY HISTORY: Family History  Problem Relation Age of Onset  . Breast cancer Sister     lymph nodes  . Parkinson's disease Brother   . Throat cancer Brother   . Macular degeneration Brother     SOCIAL HISTORY: History   Social History  . Marital Status: Widowed    Spouse Name: N/A  . Number of Children: 2  . Years of Education: 12   Occupational History  . [employer]    Social History Main Topics  . Smoking status: Never Smoker   . Smokeless tobacco: Never Used  . Alcohol Use: No  . Drug Use: No  . Sexual Activity: Not on file   Other Topics Concern  . Not on file   Social History Narrative   Patient is widowed and her daughter and grandson lives with her.   Patient does drink one glass of tea daily.   Patient works on an  Theatre stage manager.   Patient has a high school education.   Patient has two adult children.   Patient is right-handed.                        PHYSICAL EXAM  Filed Vitals:   03/10/15 1547  BP: 138/80  Pulse: 76  Resp: 18  Height:  (1.676 m)  Weight: 144 lb (65.318 kg)   Body mass index is 23.25 kg/(m^2).  Generalized: Well developed, in no acute distress   Neurological examination  Mentation: Alert oriented to time, place, history taking. Follows all commands speech and language fluent Cranial nerve II-XII: Pupils were equal round reactive to light. Extraocular movements were full, visual field were full on confrontational test. Facial sensation and strength were normal. Uvula tongue midline. Head turning and shoulder shrug  were normal and symmetric. Motor: The motor  testing reveals 5 over 5 strength of all 4 extremities. Good symmetric motor tone is noted throughout.  Sensory: Sensory testing is intact to soft touch on all 4 extremities. Pinprick decrease in stocking like pattern bilaterally however worse on the left. Decrease vibration sensation on the left. Position sense intact bilaterally. No evidence of extinction is noted.  Coordination: Cerebellar testing reveals good finger-nose-finger and heel-to-shin bilaterally.  Gait and station: Gait is normal. Tandem gait is normal. Romberg is negative. No drift is seen.  Reflexes: Deep tendon reflexes are symmetric and normal bilaterally.    DIAGNOSTIC DATA (LABS, IMAGING,  TESTING) - I reviewed patient records, labs, notes, testing and imaging myself where available.      Component Value Date/Time   PROT 6.4 06/26/2014 1054    Lab Results  Component Value Date   VITAMINB12 400 06/26/2014   Lab Results  Component Value Date   TSH 0.593 06/26/2014      ASSESSMENT AND PLAN 69 y.o. year old female with neuropathy.   1. Burning and tingling in lower extremities left greater than right, the associated skin changes make me think that this could be a hepatitis or cold agglutinin related skin change. The patient is 69 years old and could well be and the group that has otherwise silent hepatitis.  The patient's discomfort was controlled with gabapentin 300 mg at bedtime. She states recently the pain has increased.  The daytime dose let to more sleepiness.  The patient states that her symptoms normally occur with activity and improve with rest. I will increase her gabapentin to 300 mg in the PM . and 300 mg in the night .  The patient has had nerve conduction studies with the EMG that only showed carpal tunnel syndrome.  The patient denies any low back pain.If her symptoms worsen or she develops new symptoms she should let us know. Ordered hepatitis panel.    follow up in 6 months with me or NP,  Dr.  Vickey Hugerohmeier. I referred to dermatology to evaluate the skin changes and the mole on the neck.   Melvyn Novasarmen , MD   03/10/2015, 3:59 PM [facility] [address], [address] [place], [place] [id number] [phone]

## 2015-03-10 NOTE — Patient Instructions (Signed)
Involuntary weight loss.

## 2015-03-11 ENCOUNTER — Telehealth: Payer: Self-pay | Admitting: Neurology

## 2015-03-11 LAB — SJOGRENS SYNDROME-B EXTRACTABLE NUCLEAR ANTIBODY

## 2015-03-11 LAB — ANA W/REFLEX IF POSITIVE: ANA: NEGATIVE

## 2015-03-11 LAB — HEPATITIS C ANTIBODY (REFLEX)

## 2015-03-11 LAB — SJOGRENS SYNDROME-A EXTRACTABLE NUCLEAR ANTIBODY

## 2015-03-11 LAB — HCV COMMENT:

## 2015-03-11 NOTE — Telephone Encounter (Signed)
I called the pharmacy to clarify.  Spoke with [facility].  He asked that we disregard the call.  Says no change is needed.

## 2015-03-11 NOTE — Telephone Encounter (Signed)
[facility]eko from [facility] [address] [phone]( [phone]) called regarding the patients Georgina Pillion(Mcsweeney, Corrie DandyMary. MRN [id number]) Rx. Prenatal Vit-Fe Fumarate-FA (PRENATAL VITAMINS) 28-0.8 MG TABS Wanting to know if he can change the dosage a little bit for insurance reasons. Please call and advise.

## 2015-03-22 ENCOUNTER — Telehealth: Payer: Self-pay | Admitting: *Deleted

## 2015-03-22 NOTE — Telephone Encounter (Signed)
I called and LMVM for pt to return call about lab results.

## 2015-03-23 NOTE — Telephone Encounter (Signed)
I called pt and lab results given along with result notes.  Consideration of rheumatologist per Dr. Vickey Hugerohmeier and pt was ok to proceed.  [facility] (Dr. Lupe Carneyean Mitchell for [facility]).  Pt was ok with this.

## 2015-03-23 NOTE — Telephone Encounter (Signed)
Patient returned call, please call and advise.

## 2015-03-23 NOTE — Progress Notes (Signed)
Quick Note:  I called Courtney House and relayed the results and consideration of rheumatologist. Courtney House was ok to see. Will defer back to Dr. Lupe Carneyean Mitchell [facility] to refer, Courtney House ok with this. ______

## 2015-03-23 NOTE — Telephone Encounter (Signed)
LMVM for pt to return call again.

## 2015-04-02 ENCOUNTER — Other Ambulatory Visit: Payer: Self-pay | Admitting: Adult Health

## 2015-04-02 NOTE — Telephone Encounter (Signed)
Last OV note says: I will increase her gabapentin to 300 mg in the PM . and 300 mg in the night .

## 2015-05-26 ENCOUNTER — Other Ambulatory Visit: Payer: Self-pay | Admitting: Neurology

## 2015-07-14 ENCOUNTER — Ambulatory Visit (INDEPENDENT_AMBULATORY_CARE_PROVIDER_SITE_OTHER): Payer: BLUE CROSS/BLUE SHIELD | Admitting: Adult Health

## 2015-07-14 ENCOUNTER — Encounter: Payer: Self-pay | Admitting: Adult Health

## 2015-07-14 VITALS — BP 140/75 | HR 76 | Ht 66.0 in | Wt 140.0 lb

## 2015-07-14 DIAGNOSIS — G629 Polyneuropathy, unspecified: Secondary | ICD-10-CM | POA: Diagnosis not present

## 2015-07-14 MED ORDER — GABAPENTIN 300 MG PO CAPS: ORAL_CAPSULE | ORAL | Status: DC

## 2015-07-14 NOTE — Progress Notes (Signed)
PATIENT: Courtney House DOB: 10-26-1946  REASON FOR VISIT: follow up- sensory neuropathy HISTORY FROM: patient  HISTORY OF PRESENT ILLNESS: Mrs. Courtney House is a 69 year old female with a history of sensory neuropathy. She returns today for follow-up. She is currently taken gabapentin. She states that she takes her first dose at 6 PM and then again before bedtime usually around 9 PM. She states this works well for her discomfort and helps her sleep at night. She states that she has no new numbness and tingling that primarily affects the legs not really the feet. She states that her discomfort is worse if she is standing still or sitting with her legs in a dependent position. She states her discomfort is better if she props her feet up or is very active. She denies any changes with her gait or balance. Overall she feels that she is doing well. Denies any new neurological symptoms. At the last visit Dr. Vickey House refer the patient to dermatology. She states that she did see a dermatologist and has been using cream on her arms and legs. She has a follow-up appointment in October with her dermatologist. She returns today for an evaluation.  HISTORY  Ms. Courtney House is a 69 year old female with a history of peripheral neuropathy. She returns today for follow-up. She is currently taking gabapentin 300 mg at bedtime and tolerating it well however she states that it is not working as well as it was. She will have puffiness around the ankle /. She has numbness and tingling that are in the legs primarily. She states that the left is still worse than the right. She states that it bothers her the most when she is up walking or standing, gets better with rest.  She had blood work at the last visit that show a low Vitamin D. Since has been on supplementation and her level increased. Denies any back pain.  She had nerve conduction studies with EM[facility] that show bilateral carpal tunnel syndrome. The patient denies having any  blood work done except with her PCP.   HISTORY 05/05/14 (CD): Her primary concern as outlined in your referral note is a painful , ongoing , abnormal sensation in both legs- but mostly in her left lower extremity. She states that she has a sense of burning and stinging , aggravated by periods of prolonged standing. She also endorsed a creepy crawly sensation under the skin. She endorsed that she feels that the dysesthesias are coming from deep in size and not just from the skin layer itself. She works 11 hours daily she reports 6 days a week in standing position. She is in Set designer, assembly line work , Chemical engineer pumps. The symptoms started some a couple of years ago, did not respond to OTC NSAIDS , such as ibuprofen. Now progressing to more frequent times and with the same intensity as 3 years ago. She has not found relief. On days when she doesn't work , her symptoms begin as soon as she leaves the bed and begins ambulating. The symptoms are not nearly as strong as on work days. There is no associated edema ,no cyanosis, no cramps - but loots of broken veins in the lower extremities. She has no known metabolic disorder (I reviewed the labs form 2012 through 2014). Dr Courtney House treats this patient with Tramadol, Temazepam and Lorazepam- these medications will not be prescribed by [facility].  03-10-15 The patient is still taking tramadol and in addition gabapentin as provided by [facility]. She  does have shiny puffy skin the left more than the right but this definitely dystrophic skin changes noticed. She is not diabetic, has never been a smoker, and has no history of thyroid disease. 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 The geometric depression score was endorsed at one point. She had no falls as of this calendar year.  REVIEW OF SYSTEMS: Out of a complete 14 system review of symptoms, the patient complains only of the following  symptoms, and all other reviewed systems are negative.  See history of present illness  ALLERGIES: No Known Allergies  HOME MEDICATIONS: Outpatient Prescriptions Prior to Visit  Medication Sig Dispense Refill  . calcium gluconate 650 MG tablet Take 650 mg by mouth daily.    . cholecalciferol (VITAMIN D) 1000 UNITS tablet Take 1,000 Units by mouth daily.    Marland Kitchen gabapentin (NEURONTIN) 100 MG capsule Increase gabapentin to 300 mg in the afternoon and 300 mg in the night . 180 capsule 6  . gabapentin (NEURONTIN) 300 MG capsule Take 300 mg in the PM and 300 mg in the night 180 capsule 0  . LORazepam (ATIVAN) 0.5 MG tablet Take by mouth. 1/2 -1 tablet twice a day as needed for anxiety    . Prenatal Vit-Fe Fumarate-FA (PRENATAL VITAMINS) 28-0.8 MG TABS Take 28 mg by mouth 1 day or 1 dose. 30 tablet 5  . temazepam (RESTORIL) 15 MG capsule Take 15 mg by mouth at bedtime as needed for sleep.    . TraMADol HCl (RYBIX ODT) 50 MG TBDP 1 tablet. Every 4 hours as needed for abdominal pain     No facility-administered medications prior to visit.    PAST MEDICAL HISTORY: Past Medical History  Diagnosis Date  . IBS (irritable bowel syndrome)   . Insomnia   . Hypercholesterolemia   . Carpal tunnel syndrome   . Anxiety   . Depression   . Sensory disease or syndrome 05/05/2014    PAST SURGICAL HISTORY: Past Surgical History  Procedure Laterality Date  . Tubal ligation  1974  . Carpal tunnel release Right     FAMILY HISTORY: Family History  Problem Relation Age of Onset  . Breast cancer Sister     lymph nodes  . Parkinson's disease Brother   . Throat cancer Brother   . Macular degeneration Brother     SOCIAL HISTORY: Social History   Social History  . Marital Status: Widowed    Spouse Name: N/A  . Number of Children: 2  . Years of Education: 12   Occupational History  . [employer]    Social History Main Topics  . Smoking status: Never Smoker   . Smokeless tobacco: Never Used  .  Alcohol Use: No  . Drug Use: No  . Sexual Activity: Not on file   Other Topics Concern  . Not on file   Social History Narrative   Patient is widowed and her daughter and grandson lives with her.   Patient does drink one glass of tea daily.   Patient works on an  Theatre stage manager.   Patient has a high school education.   Patient has two adult children.   Patient is right-handed.                        PHYSICAL EXAM  Filed Vitals:   07/14/15 1523  BP: 140/75  Pulse: 76  Height:  (1.676 m)  Weight: 140 lb (63.504 kg)   Body mass index is 22.61 kg/(m^2).  Generalized: Well developed, in no acute distress   Neurological examination  Mentation: Alert oriented to time, place, history taking. Follows all commands speech and language fluent Cranial nerve II-XII: Pupils were equal round reactive to light. Extraocular movements were full, visual field were full on confrontational test. Facial sensation and strength were normal. Uvula tongue midline. Head turning and shoulder shrug  were normal and symmetric. Motor: The motor testing reveals 5 over 5 strength of all 4 extremities. Good symmetric motor tone is noted throughout.  Sensory: Sensory testing is intact to soft touch and vibration sensation on all 4 extremities. Pinprick is decreased in the lower extremities. No evidence of extinction is noted.  Coordination: Cerebellar testing reveals good finger-nose-finger and heel-to-shin bilaterally.  Gait and station: Gait is normal. Tandem gait is normal. Romberg is negative. No drift is seen.  Reflexes: Deep tendon reflexes are symmetric and normal bilaterally.   DIAGNOSTIC DATA (LABS, IMAGING, TESTING) - I reviewed patient records, labs, notes, testing and imaging myself where available.      ASSESSMENT AND PLAN 69 y.o. year old female  has a past medical history of IBS (irritable bowel syndrome); Insomnia; Hypercholesterolemia; Carpal tunnel syndrome; Anxiety;  Depression; and Sensory disease or syndrome (05/05/2014). here with:  1. Sensory neuropathy  Overall the patient is doing well. She'll continue on gabapentin 300 mg at 6 PM and another 300 mg at 9 PM. Patient advised that if her symptoms worsen or she develops new symptoms she should let us know. She will follow-up in 6 months or sooner if needed.     Butch Penny, MSN, NP-C 07/14/2015, 3:23 PM [facility] [address] [place], [place] [address] [phone]

## 2015-07-14 NOTE — Progress Notes (Signed)
I agree with the assessment and plan as directed by NP .The patient is known to me .   ,, MD  

## 2015-07-14 NOTE — Patient Instructions (Signed)
Continue Gabapentin 300 mg at 6:00 and again at 10:00PM If your symptoms worsen or you develop new symptoms please let us know.

## 2015-09-10 ENCOUNTER — Other Ambulatory Visit: Payer: Self-pay | Admitting: Physician Assistant

## 2015-11-09 ENCOUNTER — Other Ambulatory Visit: Payer: Self-pay | Admitting: Neurology

## 2016-01-11 ENCOUNTER — Encounter: Payer: Self-pay | Admitting: Adult Health

## 2016-01-11 ENCOUNTER — Ambulatory Visit (INDEPENDENT_AMBULATORY_CARE_PROVIDER_SITE_OTHER): Payer: BLUE CROSS/BLUE SHIELD | Admitting: Adult Health

## 2016-01-11 VITALS — BP 135/64 | HR 68 | Ht 66.0 in | Wt 140.0 lb

## 2016-01-11 DIAGNOSIS — G629 Polyneuropathy, unspecified: Secondary | ICD-10-CM

## 2016-01-11 NOTE — Progress Notes (Signed)
I agree with the assessment and plan as directed by NP .The patient is known to me .   ,, MD  

## 2016-01-11 NOTE — Progress Notes (Signed)
PATIENT: Courtney House DOB: 04-27-1946  REASON FOR VISIT: follow up- sensory neuropathy HISTORY FROM: patient  HISTORY OF PRESENT ILLNESS: Ms. Courtney House is a 70 year old female with a history of sensory neuropathy. She returns today for follow-up. She is currently taking gabapentin 300 mg at 6 PM and 600 mg at bedtime. She states this is working well for her discomfort. She denies any sharp shooting pains in the legs. She states that she does have numbness that extends up to the knee. She denies any changes with her gait or balance. Denies any recent falls. She continues to work. She denies any new neurological symptoms. She returns today for evaluation.  HISTORY 07/14/15:Courtney House is a 70 year old female with a history of sensory neuropathy. She returns today for follow-up. She is currently taken gabapentin. She states that she takes her first dose at 6 PM and then again before bedtime usually around 9 PM. She states this works well for her discomfort and helps her sleep at night. She states that she has no new numbness and tingling that primarily affects the legs not really the feet. She states that her discomfort is worse if she is standing still or sitting with her legs in a dependent position. She states her discomfort is better if she props her feet up or is very active. She denies any changes with her gait or balance. Overall she feels that she is doing well. Denies any new neurological symptoms. At the last visit [name] refer the patient to dermatology. She states that she did see a dermatologist and has been using cream on her arms and legs. She has a follow-up appointment in October with her dermatologist. She returns today for an evaluation.  HISTORY  Courtney House is a 70 year old female with a history of peripheral neuropathy. She returns today for follow-up. She is currently taking gabapentin 300 mg at bedtime and tolerating it well however she states that it is not working as well as  it was. She will have puffiness around the ankle /. She has numbness and tingling that are in the legs primarily. She states that the left is still worse than the right. She states that it bothers her the most when she is up walking or standing, gets better with rest.  She had blood work at the last visit that show a low Vitamin D. Since has been on supplementation and her level increased. Denies any back pain.  She had nerve conduction studies with EM[facility] that show bilateral carpal tunnel syndrome. The patient denies having any blood work done except with her PCP.   HISTORY 05/05/14 (CD): Her primary concern as outlined in your referral note is a painful , ongoing , abnormal sensation in both legs- but mostly in her left lower extremity. She states that she has a sense of burning and stinging , aggravated by periods of prolonged standing. She also endorsed a creepy crawly sensation under the skin. She endorsed that she feels that the dysesthesias are coming from deep in size and not just from the skin layer itself. She works 11 hours daily she reports 6 days a week in standing position. She is in Set designer, assembly line work , Chemical engineer pumps. The symptoms started some a couple of years ago, did not respond to OTC NSAIDS , such as ibuprofen. Now progressing to more frequent times and with the same intensity as 3 years ago. She has not found relief. On days when she doesn't work ,  her symptoms begin as soon as she leaves the bed and begins ambulating. The symptoms are not nearly as strong as on work days. There is no associated edema ,no cyanosis, no cramps - but loots of broken veins in the lower extremities. She has no known metabolic disorder (I reviewed the labs form 2012 through 2014). [name] treats this patient with Tramadol, Temazepam and Lorazepam- these medications will not be prescribed by [facility].  03-10-15 The patient is still taking tramadol and in addition gabapentin as provided  by [facility]. She does have shiny puffy skin the left more than the right but this definitely dystrophic skin changes noticed. She is not diabetic, has never been a smoker, and has no history of thyroid disease. 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 The geometric depression score was endorsed at one point. She had no falls as of this calendar year.   REVIEW OF SYSTEMS: Out of a complete 14 system review of symptoms, the patient complains only of the following symptoms, and all other reviewed systems are negative.  See history of present illness  ALLERGIES: No Known Allergies  HOME MEDICATIONS: Outpatient Prescriptions Prior to Visit  Medication Sig Dispense Refill  . calcium gluconate 650 MG tablet Take 650 mg by mouth daily.    . cholecalciferol (VITAMIN D) 1000 UNITS tablet Take 1,000 Units by mouth daily.    Marland Kitchen gabapentin (NEURONTIN) 300 MG capsule Take 300 mg at  6PM and 300 mg in the night 10PM 180 capsule 3  . LORazepam (ATIVAN) 0.5 MG tablet Take by mouth. 1/2 -1 tablet twice a day as needed for anxiety    . Prenatal Vit-Fe Fumarate-FA (PRENATAL VITAMINS) 28-0.8 MG TABS Take 28 mg by mouth 1 day or 1 dose. 30 tablet 5  . temazepam (RESTORIL) 15 MG capsule Take 15 mg by mouth at bedtime as needed for sleep.    . TraMADol HCl (RYBIX ODT) 50 MG TBDP 1 tablet. Every 4 hours as needed for abdominal pain     No facility-administered medications prior to visit.    PAST MEDICAL HISTORY: Past Medical History  Diagnosis Date  . IBS (irritable bowel syndrome)   . Insomnia   . Hypercholesterolemia   . Carpal tunnel syndrome   . Anxiety   . Depression   . Sensory disease or syndrome 05/05/2014    PAST SURGICAL HISTORY: Past Surgical History  Procedure Laterality Date  . Tubal ligation  1974  . Carpal tunnel release Right     FAMILY HISTORY: Family History  Problem Relation Age of Onset  . Breast cancer Sister       lymph nodes  . Parkinson's disease Brother   . Throat cancer Brother   . Macular degeneration Brother     SOCIAL HISTORY: Social History   Social History  . Marital Status: Widowed    Spouse Name: N/A  . Number of Children: 2  . Years of Education: 12   Occupational History  . [employer]    Social History Main Topics  . Smoking status: Never Smoker   . Smokeless tobacco: Never Used  . Alcohol Use: No  . Drug Use: No  . Sexual Activity: Not on file   Other Topics Concern  . Not on file   Social History Narrative   Patient is widowed and her daughter and grandson lives with her.   Patient does drink one glass of tea daily.   Patient  works on an  Theatre stage manager.   Patient has a high school education.   Patient has two adult children.   Patient is right-handed.                        PHYSICAL EXAM  Filed Vitals:   01/11/16 1551  BP: 135/64  Pulse: 68  Height:  (1.676 m)  Weight: 140 lb (63.504 kg)   Body mass index is 22.61 kg/(m^2).  Generalized: Well developed, in no acute distress   Neurological examination  Mentation: Alert oriented to time, place, history taking. Follows all commands speech and language fluent Cranial nerve II-XII: Pupils were equal round reactive to light. Extraocular movements were full, visual field were full on confrontational test. Facial sensation and strength were normal. Uvula tongue midline. Head turning and shoulder shrug  were normal and symmetric. Motor: The motor testing reveals 5 over 5 strength of all 4 extremities. Good symmetric motor tone is noted throughout.  Sensory: Sensory testing is intact to soft touch on all 4 extremities.Decreased pinprick sensation in the lower extremities in a stocking like pattern. Vibration sensation intact. No evidence of extinction is noted.  Coordination: Cerebellar testing reveals good finger-nose-finger and heel-to-shin bilaterally.  Gait and station: Gait is normal. Tandem gait  is normal. Romberg is negative. No drift is seen.  Reflexes: Deep tendon reflexes are symmetric and normal bilaterally.   DIAGNOSTIC DATA (LABS, IMAGING, TESTING) - I reviewed patient records, labs, notes, testing and imaging myself where available.  ASSESSMENT AND PLAN 70 y.o. year old female  has a past medical history of IBS (irritable bowel syndrome); Insomnia; Hypercholesterolemia; Carpal tunnel syndrome; Anxiety; Depression; and Sensory disease or syndrome (05/05/2014). here with:  1. Sensory neuropathy  Overall the patient is doing well. She will continue on gabapentin 300 mg at 6 PM and 600 mg at bedtime. Patient advised that if she begins to have discomfort from the neuropathy she should let us know. She will follow-up in 6 months or sooner if needed.     Butch Penny, MSN, NP-C 01/11/2016, 3:57 PM [facility] [address] [place], [place] [address] [phone]

## 2016-01-11 NOTE — Patient Instructions (Signed)
Ok to take gabapentin 1 tablet at 6 and 2 tablets at bedtime If your symptoms worsen or you develop new symptoms please let us know.

## 2016-07-03 ENCOUNTER — Ambulatory Visit (INDEPENDENT_AMBULATORY_CARE_PROVIDER_SITE_OTHER): Payer: BLUE CROSS/BLUE SHIELD | Admitting: Adult Health

## 2016-07-03 ENCOUNTER — Encounter: Payer: Self-pay | Admitting: Adult Health

## 2016-07-03 VITALS — BP 154/77 | HR 69 | Ht 66.0 in | Wt 140.0 lb

## 2016-07-03 DIAGNOSIS — G549 Nerve root and plexus disorder, unspecified: Secondary | ICD-10-CM | POA: Diagnosis not present

## 2016-07-03 MED ORDER — GABAPENTIN 400 MG PO CAPS: ORAL_CAPSULE | ORAL | 5 refills | Status: DC

## 2016-07-03 NOTE — Progress Notes (Signed)
I agree with the assessment and plan as directed by NP .The patient is known to me .   ,, MD  

## 2016-07-03 NOTE — Progress Notes (Signed)
PATIENT: Courtney House DOB: 11/27/1945  REASON FOR VISIT: follow up- sensory neuropathy HISTORY FROM: patient  HISTORY OF PRESENT ILLNESS:  Ms. Courtney House is a 70 year old female with a history of sensory neuropathy. She returns today for follow-up.  Gabapentin was recently increased to 300 mg in the morning at 6 PM and 600 mg before bedtime. She states that she continues to have burning and tingling. She reports numbness in the legs but no discomfort. Denies any significant changes with her balance. Denies falls. She works full-time. She returns today for an evaluation.  HISTORY 01/11/16:  Ms. Courtney House is a 70 year old female with a history of sensory neuropathy. She returns today for follow-up. She is currently taking gabapentin 300 mg at 6 PM and 600 mg at bedtime. She states this is working well for her discomfort. She denies any sharp shooting pains in the legs. She states that she does have numbness that extends up to the knee. She denies any changes with her gait or balance. Denies any recent falls. She continues to work. She denies any new neurological symptoms. She returns today for evaluation.  HISTORY 07/14/15:Mrs. Courtney House is a 70 year old female with a history of sensory neuropathy. She returns today for follow-up. She is currently taken gabapentin. She states that she takes her first dose at 6 PM and then again before bedtime usually around 9 PM. She states this works well for her discomfort and helps her sleep at night. She states that she has no new numbness and tingling that primarily affects the legs not really the feet. She states that her discomfort is worse if she is standing still or sitting with her legs in a dependent position. She states her discomfort is better if she props her feet up or is very active. She denies any changes with her gait or balance. Overall she feels that she is doing well. Denies any new neurological symptoms. At the last visit Dr. Vickey Hugerohmeier refer the patient to  dermatology. She states that she did see a dermatologist and has been using cream on her arms and legs. She has a follow-up appointment in October with her dermatologist. She returns today for an evaluation.  REVIEW OF SYSTEMS: Out of a complete 14 system review of symptoms, the patient complains only of the following symptoms, and all other reviewed systems are negative.  See history of present illness  ALLERGIES: No Known Allergies  HOME MEDICATIONS: Outpatient Medications Prior to Visit  Medication Sig Dispense Refill  . calcium gluconate 650 MG tablet Take 650 mg by mouth daily.    . cholecalciferol (VITAMIN D) 1000 UNITS tablet Take 1,000 Units by mouth daily.    Marland Kitchen. gabapentin (NEURONTIN) 300 MG capsule Take 300 mg at  6PM and 300 mg in the night 10PM 180 capsule 3  . LORazepam (ATIVAN) 0.5 MG tablet Take by mouth. 1/2 -1 tablet twice a day as needed for anxiety    . Prenatal Vit-Fe Fumarate-FA (PRENATAL VITAMINS) 28-0.8 MG TABS Take 28 mg by mouth 1 day or 1 dose. 30 tablet 5  . temazepam (RESTORIL) 15 MG capsule Take 15 mg by mouth at bedtime as needed for sleep.    . TraMADol HCl (RYBIX ODT) 50 MG TBDP 1 tablet. Every 4 hours as needed for abdominal pain     No facility-administered medications prior to visit.     PAST MEDICAL HISTORY: Past Medical History:  Diagnosis Date  . Anxiety   . Carpal tunnel syndrome   .  Depression   . Hypercholesterolemia   . IBS (irritable bowel syndrome)   . Insomnia   . Sensory disease or syndrome 05/05/2014    PAST SURGICAL HISTORY: Past Surgical History:  Procedure Laterality Date  . CARPAL TUNNEL RELEASE Right   . TUBAL LIGATION  1974    FAMILY HISTORY: Family History  Problem Relation Age of Onset  . Breast cancer Sister     lymph nodes  . Parkinson's disease Brother   . Throat cancer Brother   . Macular degeneration Brother     SOCIAL HISTORY: Social History   Social History  . Marital status: Widowed    Spouse name:  N/A  . Number of children: 2  . Years of education: 12   Occupational History  . [employer]    Social History Main Topics  . Smoking status: Never Smoker  . Smokeless tobacco: Never Used  . Alcohol use No  . Drug use: No  . Sexual activity: Not on file   Other Topics Concern  . Not on file   Social History Narrative   Patient is widowed and her daughter and grandson lives with her.   Patient does drink one glass of tea daily.   Patient works on an  Theatre stage managerassembly line.   Patient has a high school education.   Patient has two adult children.   Patient is right-handed.                        PHYSICAL EXAM  Vitals:   07/03/16 1441  BP: (!) 154/77  Pulse: 69  Weight: 140 lb (63.5 kg)  Height: 5\' 6"  (1.676 m)   Body mass index is 22.6 kg/m.  Generalized: Well developed, in no acute distress   Neurological examination  Mentation: Alert oriented to time, place, history taking. Follows all commands speech and language fluent Cranial nerve II-XII: Pupils were equal round reactive to light. Extraocular movements were full, visual field were full on confrontational test. Facial sensation and strength were normal. Uvula tongue midline. Head turning and shoulder shrug  were normal and symmetric. Motor: The motor testing reveals 5 over 5 strength of all 4 extremities. Good symmetric motor tone is noted throughout.  Sensory: Sensory testing is intact to soft touchAnd pinprick on all 4 extremities. No evidence of extinction is noted.  Coordination: Cerebellar testing reveals good finger-nose-finger and heel-to-shin bilaterally.  Gait and station: Gait is normal. Tandem gait is normal. Romberg is negative. No drift is seen.  Reflexes: Deep tendon reflexes are symmetric and normal bilaterally.   DIAGNOSTIC DATA (LABS, IMAGING, TESTING) - I reviewed patient records, labs, notes, testing and imaging myself where available.  ASSESSMENT AND PLAN 70 y.o. year old female  has a past  medical history of Anxiety; Carpal tunnel syndrome; Depression; Hypercholesterolemia; IBS (irritable bowel syndrome); Insomnia; and Sensory disease or syndrome (05/05/2014). here with:  1. Sensory neuropathy  The patient continues to have discomfort. She will increase gabapentin to 400 mg in the morning and at 6 PM and 800 mg at bedtime. Advised patient that this may cause increased drowsiness. She verbalized understanding. If this is not beneficial for her discomfort she will let us know. Will follow-up in 6 months with Dr. Vickey Hugerohmeier.    Butch PennyMegan , MSN, NP-C 07/03/2016, 2:58 PM [facility] [address], [address] [place], [place] [id number] [phone]

## 2016-07-03 NOTE — Patient Instructions (Signed)
Increase gabapentin to 400 mg in the morning and at [facility]6PM. Take 2 tablets (total 800 mg) at bedtime.  If your symptoms worsen or you develop new symptoms please let us know.

## 2017-01-01 ENCOUNTER — Other Ambulatory Visit: Payer: Self-pay | Admitting: Adult Health

## 2017-01-08 ENCOUNTER — Encounter: Payer: Self-pay | Admitting: Neurology

## 2017-01-08 ENCOUNTER — Ambulatory Visit (INDEPENDENT_AMBULATORY_CARE_PROVIDER_SITE_OTHER): Payer: BLUE CROSS/BLUE SHIELD | Admitting: Neurology

## 2017-01-08 VITALS — BP 152/82 | HR 80 | Resp 14 | Ht 66.0 in | Wt 144.0 lb

## 2017-01-08 DIAGNOSIS — G629 Polyneuropathy, unspecified: Secondary | ICD-10-CM | POA: Diagnosis not present

## 2017-01-08 DIAGNOSIS — G6182 Multifocal motor neuropathy: Secondary | ICD-10-CM

## 2017-01-08 DIAGNOSIS — F4001 Agoraphobia with panic disorder: Secondary | ICD-10-CM | POA: Diagnosis not present

## 2017-01-08 MED ORDER — GABAPENTIN 400 MG PO CAPS: ORAL_CAPSULE | ORAL | 3 refills | Status: DC

## 2017-01-08 NOTE — Progress Notes (Addendum)
PATIENT: Courtney House DOB: Aug 29, 1946  REASON FOR VISIT: follow up HISTORY FROM: patient  HISTORY OF PRESENT ILLNESS:  I have the pleasure of seeing Courtney House today who had meanwhile 3 appointments with Butch Penny, no SPECT addition up. She has been diagnosed with a sensory neuropathy and reports that treatment with gabapentin has improved her symptoms. She takes 2 gabapentin at night which helps with sleep induction and maintenance of sleep as well as with pain. She does report that she feels drowsy the  following morning. She continues to take her primary care prescribed tramadol twice a day, continues to take temazepam as a sleep aid. Not provided by [facility].  She underwent her yearly wellness check, with apparently normal results. Her carapal tunnels have improved with surgery .        06-2015. MM Courtney House is a 71 year old female with a history of peripheral neuropathy. She returns today for follow-up. She is currently taking gabapentin 300 mg at bedtime and tolerating it well however she states that it is not working as well as it was. She will have puffiness around the ankle /.  She has numbness and tingling that are in the legs primarily.  She states that the left is still worse than the right. She states that it bothers her the most when she is up walking or standing, gets better with rest.  She had blood work at the last visit that show a low Vitamin D. Since has been on supplementation and her level increased. Denies any back pain.   She had nerve conduction studies with EMG that show bilateral carpal tunnel syndrome. The patient denies having any blood work done except with her PCP.  Burning and tingling in lower extremities left greater than right, the associated skin changes make me think that this could be a hepatitis or cold agglutinin related skin change. The patient is 71 years old and could well be and the group that has otherwise silent hepatitis.  The patient's  discomfort was controlled with gabapentin 300 mg at bedtime. She states recently the pain has increased.  The daytime dose let to more sleepiness.  The patient states that her symptoms normally occur with activity and improve with rest. I will increase her gabapentin to 300 mg in the PM . and 300 mg in the night .  The patient has had nerve conduction studies with the EMG that only showed carpal tunnel syndrome.  The patient denies any low back pain.If her symptoms worsen or she develops new symptoms she should let us know. Ordered hepatitis panel.    HISTORY 05/05/14 (CD): Her primary concern as outlined in your referral note is a painful , ongoing , abnormal sensation in both legs- but mostly in her left lower extremity.  She states that she has a sense of burning and stinging , aggravated by periods of prolonged standing. She also endorsed a creepy crawly sensation under the skin. She endorsed that she feels that the dysesthesias are coming from deep in size and not just from the skin layer itself. She works 11 hours daily she reports 6 days a week in standing position. She is in Set designer, assembly line work , Chemical engineer pumps.  The symptoms started some a couple of years ago, did not respond to OTC NSAIDS , such as ibuprofen.  Now progressing to more frequent times and with the same intensity as 3 years ago. She has not found relief. On days when  she doesn't work , her symptoms begin as soon as she leaves the bed and begins ambulating. The symptoms are not nearly as strong as on work days. There is no associated edema ,no cyanosis, no cramps - but loots of broken veins in the lower extremities. She has no known metabolic disorder (I reviewed the labs from 2012 through 2014).  [name] treats this patient with Tramadol, Temazepam and Lorazepam- these medications will not be prescribed by [facility].   03-10-15 The patient is still taking tramadol and in addition gabapentin as provided by [facility]. She does  have shiny puffy skin the left more than the right but this definitely dystrophic skin changes noticed. She is not diabetic, has never been a smoker, and has no history of thyroid disease. 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 The geometric depression score was endorsed at one point. She had no falls as of this calendar year.  REVIEW OF SYSTEMS: Out of a complete 14 system review of symptoms, the patient complains only of the following symptoms, and all other reviewed systems are negative.  skin changes skin dystrophy depigmentation, hyperpigmentation and scaling of the skin. Redness. Burst blood vessels. No longer ankle edema! Improved grip strength.  Tremor.    ALLERGIES: No Known Allergies  HOME MEDICATIONS: Outpatient Medications Prior to Visit  Medication Sig Dispense Refill  . calcium gluconate 650 MG tablet Take 650 mg by mouth daily.    . cholecalciferol (VITAMIN D) 1000 UNITS tablet Take 1,000 Units by mouth daily.    Marland Kitchen gabapentin (NEURONTIN) 400 MG capsule TAKE 1 CAPSULE BY MOUTH IN THE MORNING AND AT 6 PM. TAKE 2 CAPSULES AT BEDTIME. 120 capsule 1  . LORazepam (ATIVAN) 0.5 MG tablet Take by mouth. 1/2 -1 tablet twice a day as needed for anxiety    . Prenatal Vit-Fe Fumarate-FA (PRENATAL VITAMINS) 28-0.8 MG TABS Take 28 mg by mouth 1 day or 1 dose. 30 tablet 5  . sertraline (ZOLOFT) 50 MG tablet Take 50 mg by mouth daily.    . temazepam (RESTORIL) 15 MG capsule Take 15 mg by mouth at bedtime as needed for sleep.    . TraMADol HCl (RYBIX ODT) 50 MG TBDP 1 tablet every 6 (six) hours as needed. Every 4 hours as needed for abdominal pain     No facility-administered medications prior to visit.     PAST MEDICAL HISTORY: Past Medical History:  Diagnosis Date  . Anxiety   . Carpal tunnel syndrome   . Depression   . Hypercholesterolemia   . IBS (irritable bowel syndrome)   . Insomnia   . Sensory disease or  syndrome 05/05/2014    PAST SURGICAL HISTORY: Past Surgical History:  Procedure Laterality Date  . CARPAL TUNNEL RELEASE Right   . TUBAL LIGATION  1974    FAMILY HISTORY: Family History  Problem Relation Age of Onset  . Breast cancer Sister     lymph nodes  . Parkinson's disease Brother   . Throat cancer Brother   . Macular degeneration Brother     SOCIAL HISTORY: Social History   Social History  . Marital status: Widowed    Spouse name: N/A  . Number of children: 2  . Years of education: 12   Occupational History  . [employer]    Social History Main Topics  . Smoking status: Never Smoker  . Smokeless tobacco: Never Used  . Alcohol use No  .  Drug use: No  . Sexual activity: Not on file   Other Topics Concern  . Not on file   Social History Narrative   Patient is widowed and her daughter and grandson lives with her.   Patient does drink one glass of tea daily.   Patient works on an  Theatre stage manager.   Patient has a high school education.   Patient has two adult children.   Patient is right-handed.                        PHYSICAL EXAM  Vitals:   01/08/17 1415  BP: (!) 152/82  Pulse: 80  Resp: 14  Weight: 144 lb (65.3 kg)  Height: 5\' 6"  (1.676 m)   Body mass index is 23.24 kg/m.  Generalized: Well developed, in no acute distress   Neurological examination  Mentation: Alert oriented to time, place, history taking.  Slight vocal tremor noted, Follows all commands speech and language fluent Cranial nerve the patient reports no change in her ability to taste or smell.  Pupils were equal round reactive to light. Extraocular movements were full, visual field were full on confrontational test. Facial sensation and strength were normal. Uvula tongue midline. Head turning and shoulder shrug  were normal and symmetric. Motor: Grip strength is equally preserved in both hands status post carpal tunnel surgery, she has recovered some thenar eminence, she is  able to extend and flex at the biceps triceps and shoulder movements are intact lower extremity movements are intact. No restriction of range of motion is noted. symmetric motor tone is noted throughout. No rigor. Sensory: Vibration and pinprick have been diminished in both feet up to the mid cough level. This has not progressed since our last visit. Coordination: Finger-to-nose testing shows mild tremor at goal. The tremor is seen in both hands and is not noticed at rest but with action. The tremor may be accentuated by gabapentin. Gait and station: The patient reports normal gait and no recent falls, will no lightheadedness or dizziness, with a step with of 12 inches and without wide based. She wears modest shows with good support. With the decrease of edema in her feet she also has recovered some sensation. Reflexes: Deep tendon reflexes are intact.  Not attenuated.  DIAGNOSTIC DATA (LABS, IMAGING, TESTING) - I reviewed patient records, labs, notes, testing and imaging myself where available.  Geriatric depression score , 2 /1 5.     ASSESSMENT AND PLAN 71 y.o. year old female with Sensory neuropathy, peripheral neuropathy manifesting in both feet and lower extremities with a slow ascending pattern . In addition multifocal motor neuropathy, manifesting as carpal tunnel in both wrists. Surgical release has allowed her to preserve her grip strength and actually rebuild some of her mobility mortality of both hands and fingers. No numbness.   patient is still part time working. I encouraged Mrs. Harshman to start regular exercises. She is at a higher risk of developing osteoporosis if she does not incorporate some exercise routine. I would like for her to take vitamin D and a multivitamin, I like for her to walk 20 minutes a day or 30 minutes 4 times a week. She cannot swim but I would also encourage her to look into local yoga groups or P lattice. I have noticed that her posture has slightly changed  and she has become more hunched. 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  follow up in 12 months with NP, alternating with me,  Dr. Vickey Hugerohmeier.    Melvyn Novasarmen , MD   01/08/2017, 2:19 PM [facility] [address], [address] [place], [place] [id number] [phone]

## 2017-01-08 NOTE — Patient Instructions (Signed)
Focal Neuropathy Introduction Focal neuropathy is a nerve injury that affects one area of the body, such as an arm, a leg, or the face. The injury may involve one nerve or a small group of nerves. Examples of focal neuropathy include brachial plexus injury and Parsonage-Turner syndrome. What are the causes? This condition may be caused by:  A sudden cut or stretch. This can happen because of an accident or during surgery.  A small injury that happens again and again.  A compression injury. This kind of injury happens when a blood vessel, a tumor, or something else presses on a nerve for a long time.  Entrapment. This can happen to a nerve that has to pass through a narrow area.  Lack of blood to the nerves. This can be caused by certain medical conditions, like diabetes or vasculitis.  Extreme cold.  Severe burns.  Radiation. What are the signs or symptoms? Symptoms of this condition depend on where the damaged nerve is and what kind of nerve it is. For example, damage to nerves that carry signals away from the brain can cause symptoms related to movement, but damage to nerves that carry signals to the brain can cause symptoms related to feeling and pain. Symptoms affect only one area of the body. Common symptoms include:  Numbness.  Tingling.  A burning pain.  A prickling feeling.  Very sensitive skin.  Weakness.  Paralysis.  Muscle twitching.  Muscles getting smaller (muscle wasting).  Poor coordination. How is this diagnosed? This condition may be diagnosed with:  A neurologic exam. During the exam, your health care provider will check your reflexes, how you move, and what you can feel.  Tests. Tests may be done to help find the area that has been damaged. Tests may include:  Imaging tests, such as a CT scan or MRI.  Electromyogram (EMG). This test checks the nerves that control your muscles.  Nerve conduction velocity (NCV) tests. These tests check how  quickly messages pass through your nerves. How is this treated? Treatment for this condition depends on what damaged the nerve and how much of the nerve is damaged. Treatment may involve:  Surgery to ease pressure on a nerve. This may be done if you have a compression injury or entrapment.  Medicines, such as:  Medicines for pain, such painkillers, certain anti-seizure medicines, or antidepressants.  Steroid medicines. These may be given in pill form or with a shot (injection).  Physical therapy (PT) to help movement.  Splints or other devices to help movement. Follow these instructions at home:   Take over-the-counter and prescription medicines only as told by your health care provider.  If you were given a splint or other device to help with movement, use it as told by your health care provider.  If any part of your body is numb, check it every day for signs of injury or infection. Watch for:  Redness, swelling, or pain.  Fluid or blood.  Warmth.  Pus or a bad smell.  Do not do things that put pressure on your damaged nerve. You may have to avoid certain activities or avoid sitting or lying in a certain way.  Do not smoke. Smoking keeps blood from getting to damaged nerves. If you need help quitting, ask your health care provider.  Limit alcohol intake, or avoid alcohol completely. Too much alcohol can cause a lack of B vitamins, which are needed for healthy nerves.  Have a good support system. Coping with focal neuropathy  can be stressful. Talk with a mental health caregiver or join a support group if you are struggling.  Keep all follow-up visits as told by your health care provider. This is important. These include PT visits. Contact a health care provider if:  You think you have an injury or infection.  You have new symptoms.  You are struggling emotionally from dealing with your condition. Get help right away if:  You have severe pain that comes on  suddenly.  You develop any paralysis.  You lose feeling in any part of your body. This information is not intended to replace advice given to you by your health care provider. Make sure you discuss any questions you have with your health care provider. Document Released: 10/11/2015 Document Revised: 04/06/2016 Document Reviewed: 05/28/2015  2017 Elsevier

## 2017-03-12 ENCOUNTER — Other Ambulatory Visit: Payer: Self-pay | Admitting: Neurology

## 2017-05-10 ENCOUNTER — Telehealth: Payer: Self-pay | Admitting: Neurology

## 2017-05-10 ENCOUNTER — Other Ambulatory Visit: Payer: Self-pay | Admitting: *Deleted

## 2017-05-10 MED ORDER — GABAPENTIN 400 MG PO CAPS: ORAL_CAPSULE | ORAL | 1 refills | Status: DC

## 2017-05-10 NOTE — Telephone Encounter (Signed)
Pt daughter called Olegario Messier(kathy her # is listed for alt contact's but no DPR on file) called for pt stating pt wants an increase on her gabapentin (NEURONTIN) 400 MG capsule, pt daughter stated pt says the current dosage is not working.  A message can be left on home#

## 2017-05-10 NOTE — Telephone Encounter (Signed)
Left message for a return call

## 2017-05-10 NOTE — Telephone Encounter (Signed)
Pt has called back to answer your question you had for her, please call

## 2017-05-10 NOTE — Addendum Note (Signed)
Addended by: Lindell SparKIRKMAN, MICHELLE C on: 05/10/2017 05:06 PM   Modules accepted: Orders

## 2017-05-10 NOTE — Telephone Encounter (Signed)
Spoke to patient - she was experiencing increased pain in her legs and increased her gabapentin to her previous dose.  She started this one week ago.  Gabapentin 400mg  - one tab in am, one tab in evening and two tabs at bedtime.  Reviewed with Dr. Vickey Hugerohmeier and she gave verbal permission to send in a new prescription for the patient.  Pt aware to check with her pharmacy.

## 2017-08-17 NOTE — Telephone Encounter (Signed)
ERROR

## 2017-11-04 ENCOUNTER — Other Ambulatory Visit: Payer: Self-pay | Admitting: Neurology

## 2018-01-09 ENCOUNTER — Ambulatory Visit: Payer: BLUE CROSS/BLUE SHIELD | Admitting: Adult Health

## 2018-01-09 ENCOUNTER — Encounter: Payer: Self-pay | Admitting: Adult Health

## 2018-01-09 VITALS — BP 136/78 | HR 64 | Ht 66.0 in | Wt 149.4 lb

## 2018-01-09 DIAGNOSIS — G629 Polyneuropathy, unspecified: Secondary | ICD-10-CM

## 2018-01-09 MED ORDER — GABAPENTIN 400 MG PO CAPS: ORAL_CAPSULE | ORAL | 3 refills | Status: DC

## 2018-01-09 NOTE — Progress Notes (Signed)
PATIENT: Courtney SaversMary H House DOB: 04/13/1946  REASON FOR VISIT: follow up HISTORY FROM: patient  HISTORY OF PRESENT ILLNESS: Today 01/09/18 Ms. Courtney House is a 72 year old female with a history of sensory neuropathy.  She returns today for follow-up.  She is currently taking gabapentin 400 mg in the morning, 400 mg at lunch and 800 mg at bedtime during she reports that this controls her discomfort.  She reports occasionally she will have burning and tingling in the lower extremities.  She denies any significant changes with her gait or balance.  Denies any falls.  She does state that she tries to walk regularly.  She returns today for evaluation.  HISTORY 07/03/16: Ms. Courtney House is a 72 year old female with a history of sensory neuropathy. She returns today for follow-up.  Gabapentin was recently increased to 300 mg in the morning at 6 PM and 600 mg before bedtime. She states that she continues to have burning and tingling. She reports numbness in the legs but no discomfort. Denies any significant changes with her balance. Denies falls. She works full-time. She returns today for an evaluation.  HISTORY 01/11/16:  Ms. Courtney House is a 72 year old female with a history of sensory neuropathy. She returns today for follow-up. She is currently taking gabapentin 300 mg at 6 PM and 600 mg at bedtime. She states this is working well for her discomfort. She denies any sharp shooting pains in the legs. She states that she does have numbness that extends up to the knee. She denies any changes with her gait or balance. Denies any recent falls. She continues to work. She denies any new neurological symptoms. She returns today for evaluation.:   REVIEW OF SYSTEMS: Out of a complete 14 system review of symptoms, the patient complains only of the following symptoms, and all other reviewed systems are negative.   See HPI  ALLERGIES: No Known Allergies  HOME MEDICATIONS: Outpatient Medications Prior to Visit  Medication Sig  Dispense Refill  . calcium gluconate 650 MG tablet Take 650 mg by mouth daily.    . cholecalciferol (VITAMIN D) 1000 UNITS tablet Take 1,000 Units by mouth daily.    Marland Kitchen. gabapentin (NEURONTIN) 400 MG capsule TAKE ONE CAPSULE BY MOUTH IN THE MORNING AND TAKE 1 CAPSULE IN EVENING AND TAKE 2 CAPSULES AT BEDTIME 360 capsule 0  . Prenatal Vit-Fe Fumarate-FA (PRENATAL VITAMINS) 28-0.8 MG TABS Take 28 mg by mouth 1 day or 1 dose. 30 tablet 5  . sertraline (ZOLOFT) 50 MG tablet Take 50 mg by mouth daily.    . temazepam (RESTORIL) 15 MG capsule Take 15 mg by mouth at bedtime as needed for sleep.    . TraMADol HCl (RYBIX ODT) 50 MG TBDP 1 tablet every 6 (six) hours as needed. Every 4 hours as needed for abdominal pain     No facility-administered medications prior to visit.     PAST MEDICAL HISTORY: Past Medical History:  Diagnosis Date  . Anxiety   . Carpal tunnel syndrome   . Depression   . Hypercholesterolemia   . IBS (irritable bowel syndrome)   . Insomnia   . Sensory disease or syndrome 05/05/2014    PAST SURGICAL HISTORY: Past Surgical History:  Procedure Laterality Date  . CARPAL TUNNEL RELEASE Right   . TUBAL LIGATION  1974    FAMILY HISTORY: Family History  Problem Relation Age of Onset  . Breast cancer Sister        lymph nodes  . Parkinson's disease Brother   .  Throat cancer Brother   . Macular degeneration Brother     SOCIAL HISTORY: Social History   Socioeconomic History  . Marital status: Widowed    Spouse name: Not on file  . Number of children: 2  . Years of education: 7  . Highest education level: Not on file  Social Needs  . Financial resource strain: Not on file  . Food insecurity - worry: Not on file  . Food insecurity - inability: Not on file  . Transportation needs - medical: Not on file  . Transportation needs - non-medical: Not on file  Occupational History  . Occupation: [employer]  Tobacco Use  . Smoking status: Never Smoker  . Smokeless  tobacco: Never Used  Substance and Sexual Activity  . Alcohol use: No    Alcohol/week: 0.0 oz  . Drug use: No  . Sexual activity: Not on file  Other Topics Concern  . Not on file  Social History Narrative   Patient is widowed and her daughter and grandson lives with her.   Patient does drink one glass of tea daily.   Patient works on an  Theatre stage manager.   Patient has a high school education.   Patient has two adult children.   Patient is right-handed.                     PHYSICAL EXAM  Vitals:   01/09/18 1412  BP: 136/78  Pulse: 64  Weight: 149 lb 6.4 oz (67.8 kg)  Height: 5\' 6"  (1.676 m)   Body mass index is 24.11 kg/m.  Generalized: Well developed, in no acute distress   Neurological examination  Mentation: Alert oriented to time, place, history taking. Follows all commands speech and language fluent Cranial nerve II-XII: Pupils were equal round reactive to light. Extraocular movements were full, visual field were full on confrontational test. Facial sensation and strength were normal. Uvula tongue midline. Head turning and shoulder shrug  were normal and symmetric. Motor: The motor testing reveals 5 over 5 strength of all 4 extremities. Good symmetric motor tone is noted throughout.  Sensory: Sensory testing is intact to soft touch on all 4 extremities.  Pinprick sensation decreased in the right lower extremity in a stocking-like pattern.  No evidence of extinction is noted.  Coordination: Cerebellar testing reveals good finger-nose-finger and heel-to-shin bilaterally.  Gait and station: Gait is normal. Tandem gait is normal. Romberg is negative. No drift is seen.  Reflexes: Deep tendon reflexes are symmetric and normal bilaterally.   DIAGNOSTIC DATA (LABS, IMAGING, TESTING) - I reviewed patient records, labs, notes, testing and imaging myself where available.        ASSESSMENT AND PLAN 72 y.o. year old female  has a past medical history of Anxiety, Carpal  tunnel syndrome, Depression, Hypercholesterolemia, IBS (irritable bowel syndrome), Insomnia, and Sensory disease or syndrome (05/05/2014). here with:  1.  Sensory neuropathy  Overall the patient is doing well.  She will continue on gabapentin 400 mg in the morning of 400 mg in the evening and 800 mg at bedtime.  She is advised that if her symptoms worsen or she develops new symptoms she should let us know.  She will follow-up in 6 months or sooner if needed.  I spent 15 minutes with the patient. 50% of this time was spent discussing her medication     Butch Penny, MSN, NP-C 01/09/2018, 2:11 PM [facility] [address] [place], [place] [address] [phone]

## 2018-01-09 NOTE — Patient Instructions (Signed)
Your Plan:  Continue gabapentin  If your symptoms worsen or you develop new symptoms please let us know.    Thank you for coming to see us at [facility]. I hope we have been able to provide you high quality care today.  You may receive a patient satisfaction survey over the next few weeks. We would appreciate your feedback and comments so that we may continue to improve ourselves and the health of our patients.  

## 2019-01-13 ENCOUNTER — Encounter: Payer: Self-pay | Admitting: Neurology

## 2019-01-13 ENCOUNTER — Ambulatory Visit: Payer: BLUE CROSS/BLUE SHIELD | Admitting: Neurology

## 2019-01-13 VITALS — BP 155/83 | HR 69 | Ht 66.0 in | Wt 158.0 lb

## 2019-01-13 DIAGNOSIS — G608 Other hereditary and idiopathic neuropathies: Secondary | ICD-10-CM | POA: Diagnosis not present

## 2019-01-13 MED ORDER — GABAPENTIN 400 MG PO CAPS: ORAL_CAPSULE | ORAL | 3 refills | Status: DC

## 2019-01-13 NOTE — Progress Notes (Signed)
PATIENT: Courtney House DOB: 01-Mar-1946  REASON FOR VISIT: follow up HISTORY FROM: patient  HISTORY OF PRESENT ILLNESS: Today 01/13/19 , Have thepleasure of meeting today Ms. Jimmie Molly. House for a yearly revisit.  The patient is now 73 years old, her primary care physician is Dr. Lupe Carney.  She is doing remarkably well with 400 mg of gabapentin in the morning and 800 mg at bedtime as well as 400 mg as needed at lunch.  She started a new job and works alongside 1 of her best friends, seems energetic and really happy.  There were no significant changes in her health overall and she had one fall before she changed jobs. She was under stress at the time, about changing jobs- and since she changed she feels much more at ease.   Courtney House is a 73 year old female with a history of sensory neuropathy.  She returns today for follow-up.  She is currently taking gabapentin 400 mg in the morning, 400 mg at lunch and 800 mg at bedtime during she reports that this controls her discomfort.  She reports occasionally she will have burning and tingling in the lower extremities.  She denies any significant changes with her gait or balance.  Denies any falls.  She does state that she tries to walk regularly.  She returns today for evaluation.  HISTORY 07/03/16: Courtney House is a 73 year old female with a history of sensory neuropathy. She returns today for follow-up.  Gabapentin was recently increased to 300 mg in the morning at 6 PM and 600 mg before bedtime. She states that she continues to have burning and tingling. She reports numbness in the legs but no discomfort. Denies any significant changes with her balance. Denies falls. She works full-time. She returns today for an evaluation.  HISTORY 01/11/16:  Courtney House is a 73 year old female with a history of sensory neuropathy. She returns today for follow-up. She is currently taking gabapentin 300 mg at 6 PM and 600 mg at bedtime. She states this is working well  for her discomfort. She denies any sharp shooting pains in the legs. She states that she does have numbness that extends up to the knee. She denies any changes with her gait or balance. Denies any recent falls. She continues to work. She denies any new neurological symptoms. She returns today for evaluation.:   REVIEW OF SYSTEMS: Out of a complete 14 system review of symptoms, the patient complains only of the following symptoms, and all other reviewed systems are negative.   She is happy, likes her new job, and is off at 2.30 PM.  One fall. No pain, less burning- prefers to take her shoes off when at her desk.   ALLERGIES: No Known Allergies  HOME MEDICATIONS: Outpatient Medications Prior to Visit  Medication Sig Dispense Refill  . calcium gluconate 650 MG tablet Take 650 mg by mouth daily.    . cholecalciferol (VITAMIN D) 1000 UNITS tablet Take 1,000 Units by mouth daily.    Marland Kitchen gabapentin (NEURONTIN) 400 MG capsule TAKE ONE CAPSULE BY MOUTH IN THE MORNING AND TAKE 1 CAPSULE IN EVENING AND TAKE 2 CAPSULES AT BEDTIME 360 capsule 3  . LORazepam (ATIVAN) 0.5 MG tablet TK 1/2 TO 1 T PO BID PRA  0  . Prenatal Vit-Fe Fumarate-FA (PRENATAL VITAMINS) 28-0.8 MG TABS Take 28 mg by mouth 1 day or 1 dose. 30 tablet 5  . temazepam (RESTORIL) 15 MG capsule Take 15 mg by mouth at bedtime  as needed for sleep.    . TraMADol HCl (RYBIX ODT) 50 MG TBDP 1 tablet every 6 (six) hours as needed. Every 4 hours as needed for abdominal pain    . sertraline (ZOLOFT) 50 MG tablet Take 50 mg by mouth daily.    . hyoscyamine (LEVSIN, ANASPAZ) 0.125 MG tablet TK 1 T PO Q 4 H PRF ABD PAIN     No facility-administered medications prior to visit.     PAST MEDICAL HISTORY: Past Medical History:  Diagnosis Date  . Anxiety   . Carpal tunnel syndrome   . Depression   . Hypercholesterolemia   . IBS (irritable bowel syndrome)   . Insomnia   . Sensory disease or syndrome 05/05/2014    PAST SURGICAL HISTORY: Past  Surgical History:  Procedure Laterality Date  . CARPAL TUNNEL RELEASE Right   . TUBAL LIGATION  1974    FAMILY HISTORY: Family History  Problem Relation Age of Onset  . Breast cancer Sister        lymph nodes  . Parkinson's disease Brother   . Throat cancer Brother   . Macular degeneration Brother     SOCIAL HISTORY: Social History   Socioeconomic History  . Marital status: Widowed    Spouse name: Not on file  . Number of children: 2  . Years of education: 58  . Highest education level: Not on file  Occupational History  . Occupation: Training and development officer  Social Needs  . Financial resource strain: Not on file  . Food insecurity:    Worry: Not on file    Inability: Not on file  . Transportation needs:    Medical: Not on file    Non-medical: Not on file  Tobacco Use  . Smoking status: Never Smoker  . Smokeless tobacco: Never Used  Substance and Sexual Activity  . Alcohol use: No    Alcohol/week: 0.0 standard drinks  . Drug use: No  . Sexual activity: Not on file  Lifestyle  . Physical activity:    Days per week: Not on file    Minutes per session: Not on file  . Stress: Not on file  Relationships  . Social connections:    Talks on phone: Not on file    Gets together: Not on file    Attends religious service: Not on file    Active member of club or organization: Not on file    Attends meetings of clubs or organizations: Not on file    Relationship status: Not on file  . Intimate partner violence:    Fear of current or ex partner: Not on file    Emotionally abused: Not on file    Physically abused: Not on file    Forced sexual activity: Not on file  Other Topics Concern  . Not on file  Social History Narrative   Patient is widowed and her daughter and grandson lives with her.   Patient does drink one glass of tea daily.   Patient works on an  Theatre stage manager.   Patient has a high school education.   Patient has two adult children.   Patient is right-handed.                      PHYSICAL EXAM  Vitals:   01/13/19 1512  BP: (!) 155/83  Pulse: 69  Weight: 158 lb (71.7 kg)  Height:  (1.676 m)   Body mass index is 25.5 kg/m.  Generalized: Well developed,  in no acute distress   Neurological examination  Mentation: Alert oriented to time, place, history taking. Follows all commands, her  speech and language are fluent Cranial nerve : no loss of taste or smell.  Pupils were equal round reactive to light. Extraocular movements were full, the visual fields were full on confrontational test.  Facial sensation and strength were normal. Uvula and tongue midline.  Head turning and shoulder shrug  were normal and symmetric. Motor: full strength of all 4 extremities, symmetric motor tone is noted throughout.  Sensory: Sensory testing is intact to soft touch on all 4 extremities.  Pinprick sensation decreased in the right lower extremity in a stocking-like pattern.  Coordination: Cerebellar testing reveals intact  finger-nose-maneuver  bilaterally.  Gait and station: Gait is flat footed but quick, no assistance is needed to rise from the chair, not bracing (!) and turning with 3 steps.  Tandem gait is normal. Romberg is negative. No drift is seen.  Reflexes: Deep tendon reflexes are symmetric and normal bilaterally.   DIAGNOSTIC DATA (LABS, IMAGING, TESTING) :  - I reviewed patient records, labs, notes, testing and imaging myself where available.  ASSESSMENT AND PLAN :  73 y.o. year old female  has a past medical history of Anxiety, Carpal tunnel syndrome, Depression, Hypercholesterolemia, IBS (irritable bowel syndrome), Insomnia, and Sensory disease or syndrome (05/05/2014). here with:  1.  Sensory neuropathy, affecting sleep.  This patient is doing well, her neuropathy is controlled, she has started a new job.  She will continue to take Gabapentin 400 mg ( in the morning) of 400 mg  (in the evening) and 800 mg (at bedtime) po.   She is  advised that if her symptoms worsen or she develops new symptoms she should let us know.  She will follow-up in 12 months with NP [place] again.   Melvyn Novas, MD  01/13/2019, 3:13 PM [facility] [address] [place], [place] [address] [phone]

## 2020-01-05 ENCOUNTER — Other Ambulatory Visit: Payer: Self-pay

## 2020-01-05 ENCOUNTER — Encounter (HOSPITAL_COMMUNITY): Payer: Self-pay | Admitting: Emergency Medicine

## 2020-01-05 ENCOUNTER — Emergency Department (HOSPITAL_COMMUNITY)
Admission: EM | Admit: 2020-01-05 | Discharge: 2020-01-05 | Disposition: A | Discharge status: Home / Self Care | Payer: BC Managed Care – PPO | Attending: Emergency Medicine | Admitting: Emergency Medicine

## 2020-01-05 ENCOUNTER — Emergency Department (HOSPITAL_COMMUNITY): Payer: BC Managed Care – PPO

## 2020-01-05 DIAGNOSIS — R55 Syncope and collapse: Secondary | ICD-10-CM | POA: Diagnosis present

## 2020-01-05 DIAGNOSIS — R1030 Lower abdominal pain, unspecified: Secondary | ICD-10-CM | POA: Insufficient documentation

## 2020-01-05 DIAGNOSIS — Z79899 Other long term (current) drug therapy: Secondary | ICD-10-CM | POA: Insufficient documentation

## 2020-01-05 LAB — CBC WITH DIFFERENTIAL/PLATELET
Abs Immature Granulocytes: 0.04 10*3/uL (ref 0.00–0.07)
Basophils Absolute: 0 10*3/uL (ref 0.0–0.1)
Basophils Relative: 0 %
Eosinophils Absolute: 0.1 10*3/uL (ref 0.0–0.5)
Eosinophils Relative: 1 %
HCT: 39.2 % (ref 36.0–46.0)
Hemoglobin: 12.6 g/dL (ref 12.0–15.0)
Immature Granulocytes: 0 %
Lymphocytes Relative: 6 %
Lymphs Abs: 0.6 10*3/uL — ABNORMAL LOW (ref 0.7–4.0)
MCH: 31.1 pg (ref 26.0–34.0)
MCHC: 32.1 g/dL (ref 30.0–36.0)
MCV: 96.8 fL (ref 80.0–100.0)
Monocytes Absolute: 0.8 10*3/uL (ref 0.1–1.0)
Monocytes Relative: 7 %
Neutro Abs: 8.9 10*3/uL — ABNORMAL HIGH (ref 1.7–7.7)
Neutrophils Relative %: 86 %
Platelets: 141 10*3/uL — ABNORMAL LOW (ref 150–400)
RBC: 4.05 MIL/uL (ref 3.87–5.11)
RDW: 14.6 % (ref 11.5–15.5)
WBC: 10.4 10*3/uL (ref 4.0–10.5)
nRBC: 0 % (ref 0.0–0.2)

## 2020-01-05 LAB — URINALYSIS, ROUTINE W REFLEX MICROSCOPIC
Bilirubin Urine: NEGATIVE
Glucose, UA: NEGATIVE mg/dL
Hgb urine dipstick: NEGATIVE
Ketones, ur: 5 mg/dL — AB
Leukocytes,Ua: NEGATIVE
Nitrite: NEGATIVE
Protein, ur: NEGATIVE mg/dL
Specific Gravity, Urine: 1.009 (ref 1.005–1.030)
pH: 7 (ref 5.0–8.0)

## 2020-01-05 LAB — COMPREHENSIVE METABOLIC PANEL
ALT: 15 U/L (ref 0–44)
AST: 22 U/L (ref 15–41)
Albumin: 3.8 g/dL (ref 3.5–5.0)
Alkaline Phosphatase: 53 U/L (ref 38–126)
Anion gap: 7 (ref 5–15)
BUN: 12 mg/dL (ref 8–23)
CO2: 28 mmol/L (ref 22–32)
Calcium: 8.5 mg/dL — ABNORMAL LOW (ref 8.9–10.3)
Chloride: 105 mmol/L (ref 98–111)
Creatinine, Ser: 0.8 mg/dL (ref 0.44–1.00)
GFR calc Af Amer: >60 mL/min (ref >60)
GFR calc non Af Amer: >60 mL/min (ref >60)
Glucose, Bld: 122 mg/dL — ABNORMAL HIGH (ref 70–99)
Potassium: 3.8 mmol/L (ref 3.5–5.1)
Sodium: 140 mmol/L (ref 135–145)
Total Bilirubin: 0.6 mg/dL (ref 0.3–1.2)
Total Protein: 6.8 g/dL (ref 6.5–8.1)

## 2020-01-05 LAB — PROTIME-INR
INR: 1 (ref 0.8–1.2)
Prothrombin Time: 12.6 seconds (ref 11.4–15.2)

## 2020-01-05 LAB — TROPONIN I (HIGH SENSITIVITY)
Troponin I (High Sensitivity): 3 ng/L (ref <18)
Troponin I (High Sensitivity): 3 ng/L (ref <18)

## 2020-01-05 MED ORDER — SODIUM CHLORIDE 0.9 % IV BOLUS (SEPSIS)
500.0000 mL | Freq: Once | INTRAVENOUS | Status: AC
  Administered 2020-01-05: 500 mL via INTRAVENOUS

## 2020-01-05 MED ORDER — IOHEXOL 300 MG/ML  SOLN
100.0000 mL | Freq: Once | INTRAMUSCULAR | Status: AC | PRN
  Administered 2020-01-05: 100 mL via INTRAVENOUS

## 2020-01-05 MED ORDER — SODIUM CHLORIDE 0.9 % IV SOLN
1000.0000 mL | INTRAVENOUS | Status: DC
  Administered 2020-01-05: 1000 mL via INTRAVENOUS

## 2020-01-05 MED ORDER — SODIUM CHLORIDE (PF) 0.9 % IJ SOLN
INTRAMUSCULAR | Status: AC
  Filled 2020-01-05: qty 50

## 2020-01-05 NOTE — ED Provider Notes (Signed)
[facility] DEPT Provider Note   CSN: [id number] Arrival date & time: 01/05/20  1148     History Chief Complaint  Patient presents with  . Hypotension    Courtney House is a 74 y.o. female.  HPI Patient reports that she was at work today.  She was doing a task that requires moving a part to a basket.  Also, she does sit down and then stand up at times.  Patient reports that she has irritable bowel and has been having some diarrhea over the past day.  She took an antidiarrheal agent.  She noted that she was having some cramping in her lower abdomen and discomfort.  She had tried to go to the bathroom but only small amounts were passing.  She then went back to her workstation and shortly thereafter with going from sitting to standing lost consciousness.  She reports she immediately awakened and recognized that she had briefly passed out.  She denies she is experienced any chest pain or shortness of breath.  Patient denies there is any injury associated with the event.  At this time, she reports feeling back to normal.  Patient reports that she frequently deals with abdominal discomfort due to IBS.  She can vacillate between constipation and diarrhea.  Here recently, she has had more diarrheal bowel movements.  No blood.  She reports she also has been experiencing some lower abdominal discomfort that is been more persistent than usual.  Is more to the left and lower abdomen.  No urinary symptoms.    Past Medical History:  Diagnosis Date  . Anxiety   . Carpal tunnel syndrome   . Depression   . Hypercholesterolemia   . IBS (irritable bowel syndrome)   . Insomnia   . Sensory disease or syndrome 05/05/2014    Patient Active Problem List   Diagnosis Date Noted  . Sensory disease or syndrome 05/05/2014    Past Surgical History:  Procedure Laterality Date  . CARPAL TUNNEL RELEASE Right   . TUBAL LIGATION  1974     OB History   No obstetric history on  file.     Family History  Problem Relation Age of Onset  . Breast cancer Sister        lymph nodes  . Parkinson's disease Brother   . Throat cancer Brother   . Macular degeneration Brother     Social History   Tobacco Use  . Smoking status: Never Smoker  . Smokeless tobacco: Never Used  Substance Use Topics  . Alcohol use: No    Alcohol/week: 0.0 standard drinks  . Drug use: No    Home Medications Prior to Admission medications   Medication Sig Start Date End Date Taking? Authorizing Provider  calcium gluconate 650 MG tablet Take 650 mg by mouth daily.   Yes [provider]  cholecalciferol (VITAMIN D) 1000 UNITS tablet Take 1,000 Units by mouth daily.   Yes [provider]  gabapentin (NEURONTIN) 400 MG capsule TAKE ONE CAPSULE BY MOUTH IN THE MORNING AND TAKE 1 CAPSULE IN EVENING AND TAKE 2 CAPSULES AT BEDTIME Patient taking differently: Take 400-800 mg by mouth See admin instructions. TAKE 400mg  BY MOUTH IN THE MORNING AND TAKE 400mg   IN EVENING AND TAKE 800mg   AT BEDTIME 01/13/19  Yes Dohmeier, Asencion Partridge, MD  hyoscyamine (LEVSIN, ANASPAZ) 0.125 MG tablet Take 0.125 mg by mouth every 4 (four) hours as needed (abdominal pain).  12/11/18  Yes [provider]  LORazepam (ATIVAN) 0.5 MG tablet Take 0.5 mg by mouth daily.  12/06/17  Yes [provider]  Prenatal Vit-Fe Fumarate-FA (PRENATAL VITAMINS) 28-0.8 MG TABS Take 28 mg by mouth 1 day or 1 dose. Patient taking differently: Take 28 mg by mouth daily.  03/10/15  Yes Dohmeier, Porfirio Mylar, MD  temazepam (RESTORIL) 15 MG capsule Take 15 mg by mouth at bedtime as needed for sleep.   Yes [provider]  traMADol (ULTRAM) 50 MG tablet Take 50 mg by mouth 2 (two) times daily. 50mg  in am and 50mg  in pm 12/05/19  Yes [provider]    Allergies    Patient has no known allergies.  Review of Systems   Review of Systems 10 Systems reviewed and are negative for acute change except as noted  in the HPI.  Physical Exam Updated Vital Signs BP (!) 143/67   Pulse 81   Temp 98.5 F (36.9 C) (Oral)   Resp 20   Ht 5\' 6"  (1.676 m)   Wt 70.3 kg   SpO2 98%   BMI 25.02 kg/m   Physical Exam Constitutional:      Comments: Patient is alert nontoxic and well in appearance.  Mental status clear.  HENT:     Head: Normocephalic and atraumatic.  Eyes:     Extraocular Movements: Extraocular movements intact.     Pupils: Pupils are equal, round, and reactive to light.  Cardiovascular:     Rate and Rhythm: Normal rate and regular rhythm.  Pulmonary:     Effort: Pulmonary effort is normal.     Breath sounds: Normal breath sounds.  Abdominal:     Comments: Abdomen soft.  No guarding.  Mild to moderate discomfort to palpation left lower quadrant and suprapubic.  No palpable mass.  Musculoskeletal:        General: No swelling or tenderness. Normal range of motion.     Cervical back: Neck supple.     Right lower leg: No edema.     Left lower leg: No edema.  Skin:    General: Skin is warm and dry.  Neurological:     General: No focal deficit present.     Mental Status: She is oriented to person, place, and time.     Cranial Nerves: No cranial nerve deficit.     Motor: No weakness.     Coordination: Coordination normal.  Psychiatric:        Mood and Affect: Mood normal.     ED Results / Procedures / Treatments   Labs (all labs ordered are listed, but only abnormal results are displayed) Labs Reviewed  COMPREHENSIVE METABOLIC PANEL - Abnormal; Notable for the following components:      Result Value   Glucose, Bld 122 (*)    Calcium 8.5 (*)    All other components within normal limits  CBC WITH DIFFERENTIAL/PLATELET - Abnormal; Notable for the following components:   Platelets 141 (*)    Neutro Abs 8.9 (*)    Lymphs Abs 0.6 (*)    All other components within normal limits  URINALYSIS, ROUTINE W REFLEX MICROSCOPIC - Abnormal; Notable for the following components:   Color,  Urine STRAW (*)    Ketones, ur 5 (*)    All other components within normal limits  PROTIME-INR  TROPONIN I (HIGH SENSITIVITY)  TROPONIN I (HIGH SENSITIVITY)    EKG EKG Interpretation  Date/Time:  Monday January 05 2020 12:07:37 EST Ventricular Rate:  69 PR Interval:  QRS Duration: 101 QT Interval:  399 QTC Calculation: 428 R Axis:   78 Text Interpretation: Sinus rhythm Nonspecific T abnormalities, lateral leads no change from old Confirmed by Arby Barrette [phone]) on 01/05/2020 5:28:01 PM   Radiology CT Abdomen Pelvis W Contrast  Result Date: 01/05/2020 CLINICAL DATA:  74 year old female with abdominal distension and diarrhea. EXAM: CT ABDOMEN AND PELVIS WITH CONTRAST TECHNIQUE: Multidetector CT imaging of the abdomen and pelvis was performed using the standard protocol following bolus administration of intravenous contrast. CONTRAST:  OMNIPAQUE IOHEXOL 300 MG/ML  SOLN COMPARISON:  None. FINDINGS: Lower chest: Bibasilar linear atelectasis/scarring. The visualized lung bases are otherwise clear. No intra-abdominal free air or free fluid. Hepatobiliary: The liver is grossly unremarkable. Probable mild periportal edema versus less likely intrahepatic biliary ductal dilatation. Correlation with clinical exam and LFTs recommended no calcified gallstone or pericholecystic fluid. There is no dilatation of the common bile duct. Pancreas: Unremarkable. No pancreatic ductal dilatation or surrounding inflammatory changes. Spleen: Faint ill-defined 1 cm hypodensity in the lateral inferior pole of the spleen is not characterized. Adrenals/Urinary Tract: The adrenal glands are unremarkable. There is no hydronephrosis on either side. There is symmetric enhancement and excretion of contrast by both kidneys. Small left renal cysts. The visualized ureters and urinary bladder appear unremarkable. Stomach/Bowel: There is no bowel obstruction or active inflammation. Moderate amount of dense stool noted  throughout the colon. The appendix is normal. Vascular/Lymphatic: There is moderate aortoiliac atherosclerotic disease. The IVC is unremarkable. No portal venous gas. There is no adenopathy. Reproductive: The uterus is anteverted and grossly unremarkable. The ovaries appear unremarkable as well. No pelvic mass. Other: None Musculoskeletal: Mild degenerative changes of the spine. No acute osseous pathology IMPRESSION: 1. Mild periportal edema. Correlation with clinical exam and LFTs recommended. 2. Moderate colonic stool burden. No bowel obstruction or active inflammation. Normal appendix. 3.  Aortic Atherosclerosis (ICD10-I70.0). Electronically Signed   By: Elgie Collard M.D.   On: 01/05/2020 17:03    Procedures Procedures (including critical care time)  Medications Ordered in ED Medications  sodium chloride 0.9 % bolus 500 mL (0 mLs Intravenous Stopped 01/05/20 1531)    Followed by  0.9 %  sodium chloride infusion (1,000 mLs Intravenous New Bag/Given 01/05/20 1438)  sodium chloride (PF) 0.9 % injection (  Canceled Entry 01/05/20 1701)  iohexol (OMNIPAQUE) 300 MG/ML solution 100 mL (100 mLs Intravenous Contrast Given 01/05/20 1636)    ED Course  I have reviewed the triage vital signs and the nursing notes.  Pertinent labs & imaging results that were available during my care of the patient were reviewed by me and considered in my medical decision making (see chart for details).    MDM Rules/Calculators/A&P                     Patient is clinically well on arrival.  She has syncope at work.  This occurred after abdominal discomfort and urgency for bowel movement when the patient went from sitting to standing.  I have high suspicion for vasovagal syncope with possible orthostatic component.  Patient was not orthostatic on arrival.  She does report some diarrheal bowel movement over the past several days with increased lower abdominal discomfort.  CT does not show any diverticulitis or other likely  etiology other than moderate stool burden.  Patient describes vacillating between diarrhea and constipation with her IBS.  Incidental finding of possible dilation around the hepatic portal vessels.  LFTs and other lab work is  normal.  There is no correlation with any of the patient's symptoms.  Patient is made aware of this finding so her PCP can monitor for any concerning symptoms.  Return precautions reviewed. Final Clinical Impression(s) / ED Diagnoses Final diagnoses:  Syncope and collapse  Lower abdominal pain    Rx / DC Orders ED Discharge Orders    None       Arby Barrette, MD 01/05/20 1730

## 2020-01-05 NOTE — Discharge Instructions (Addendum)
1.  Your CT shows a small amount of possible swelling around the some of the liver blood vessels.  Your labs for the liver and the appearance of the liver on CT scan is otherwise normal.  This may not have any significance but it is important that your doctor knows about it and appropriate monitoring is done to make sure this is not an early finding of a more serious condition.  It does not appear to be related to the symptoms that brought you to the emergency department today. 2.  Your other lab work was normal.  Your heart labs are normal without signs of a heart attack. 3.  It is likely you passed out due to the changes in blood pressure that occur when people go from sitting to standing especially when you have had diarrhea and may have mild dehydration. 4.  Take an extra fluids over the next 24 hours.  Take extra care when you go from lying down to sitting to standing.  If you feel at all lightheaded, immediately sit back down and try to lie down and elevate your legs.  If symptoms do not quickly pass or you develop other concerning symptoms such as chest pain, shortness of breath, weakness numbness or tingling, return to the emergency department. 5.  Follow-up with your doctor this week for recheck.

## 2020-01-05 NOTE — ED Triage Notes (Signed)
Patient arriving by EMS from home, had syncopal episode at work and was hypotensive w/ orthostatics.   BP 90/60, 130/72 HR 72 RR 16 98 RA 159 CBG  500 cc bolus

## 2020-01-14 ENCOUNTER — Ambulatory Visit: Payer: BLUE CROSS/BLUE SHIELD | Admitting: Adult Health

## 2020-01-22 ENCOUNTER — Other Ambulatory Visit: Payer: Self-pay | Admitting: Neurology

## 2020-01-22 MED ORDER — GABAPENTIN 400 MG PO CAPS: ORAL_CAPSULE | ORAL | 0 refills | Status: AC

## 2020-02-21 ENCOUNTER — Other Ambulatory Visit: Payer: Self-pay | Admitting: Neurology

## 2021-01-27 DIAGNOSIS — Z0000 Encounter for general adult medical examination without abnormal findings: Secondary | ICD-10-CM | POA: Diagnosis not present

## 2021-01-27 DIAGNOSIS — M792 Neuralgia and neuritis, unspecified: Secondary | ICD-10-CM | POA: Diagnosis not present

## 2021-01-27 DIAGNOSIS — Z23 Encounter for immunization: Secondary | ICD-10-CM | POA: Diagnosis not present

## 2021-01-27 DIAGNOSIS — E7800 Pure hypercholesterolemia, unspecified: Secondary | ICD-10-CM | POA: Diagnosis not present

## 2021-01-27 DIAGNOSIS — M85859 Other specified disorders of bone density and structure, unspecified thigh: Secondary | ICD-10-CM | POA: Diagnosis not present

## 2021-01-27 DIAGNOSIS — F419 Anxiety disorder, unspecified: Secondary | ICD-10-CM | POA: Diagnosis not present

## 2021-01-27 DIAGNOSIS — G4700 Insomnia, unspecified: Secondary | ICD-10-CM | POA: Diagnosis not present

## 2021-03-08 IMAGING — CT CT ABD-PELV W/ CM
2 of 5 series · 16 of 46 positions shown, 18 images · IV contrast (omnipaque)
Comparison: None.

CLINICAL DATA: 73-year-old female with abdominal distension and
diarrhea.

EXAM:
CT ABDOMEN AND PELVIS WITH CONTRAST
TECHNIQUE: Multidetector CT imaging of the abdomen and pelvis was performed
using the standard protocol following bolus administration of
intravenous contrast.
CONTRAST:  100mL OMNIPAQUE IOHEXOL 300 MG/ML  SOLN

[Series 2: axial st · axial · 0.70mm/px · z∈[+1161,+1551]mm · 13 of 92 slices shown, 15 images]
[im 7/92  soft-tissue]
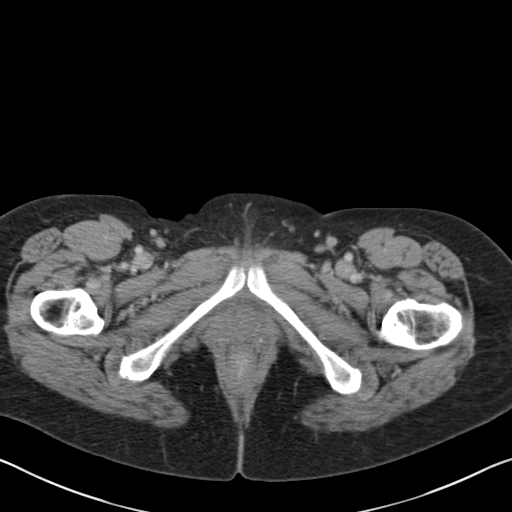
[im 7/92  bone]
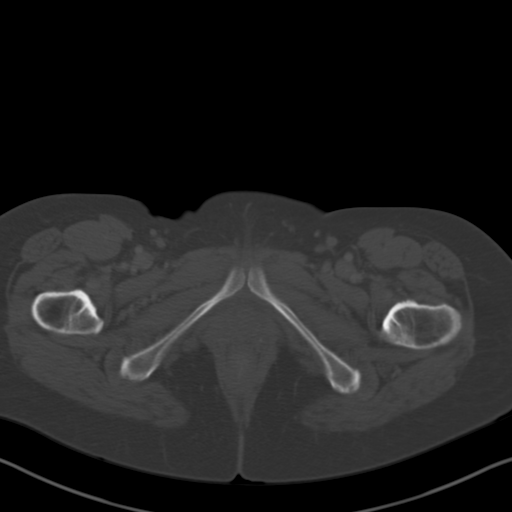
[im 13/92  soft-tissue]
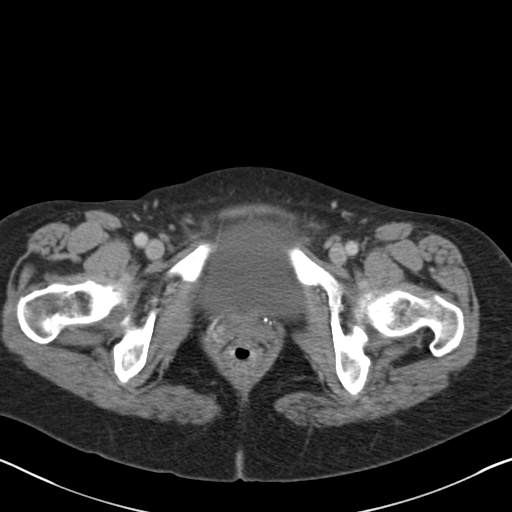
[im 19/92  soft-tissue]
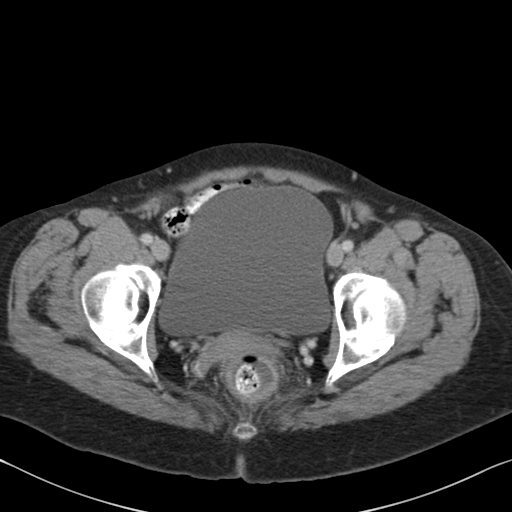
[im 25/92  soft-tissue]
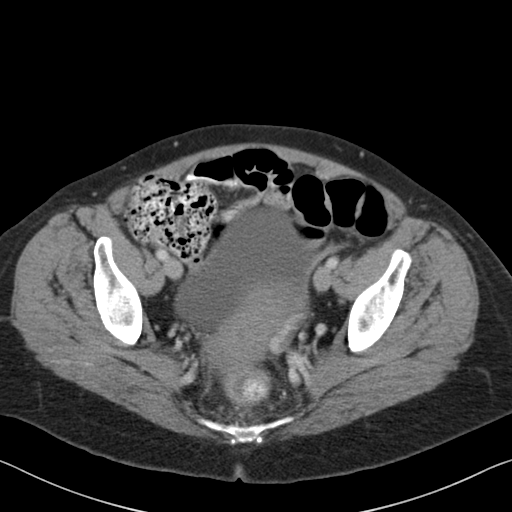
[im 31/92  soft-tissue]
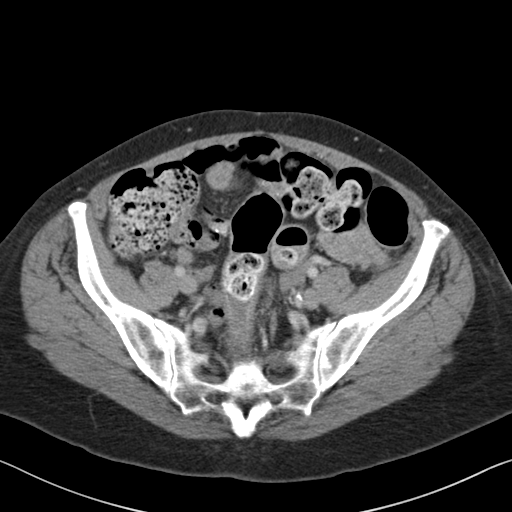
[im 37/92  soft-tissue]
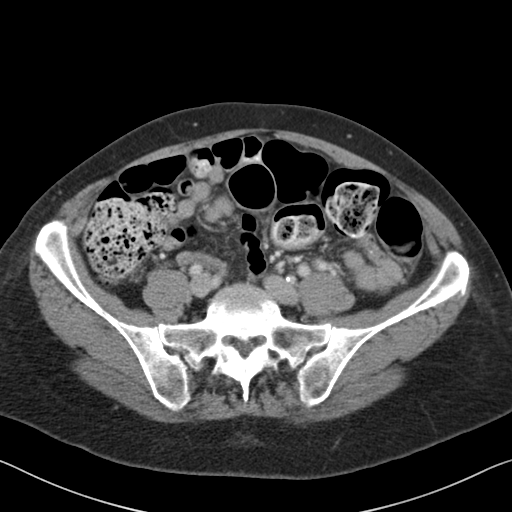
[im 49/92  soft-tissue]
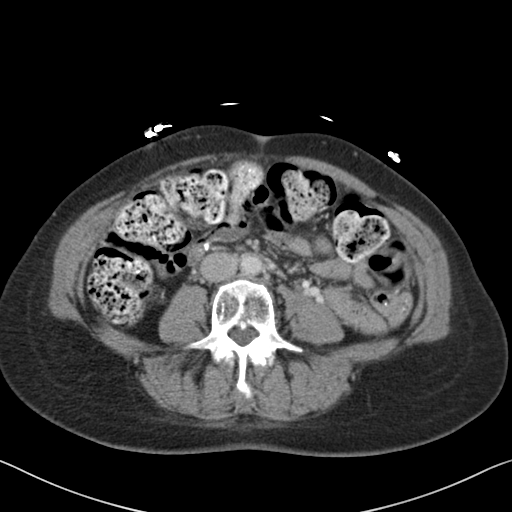
[im 55/92  soft-tissue]
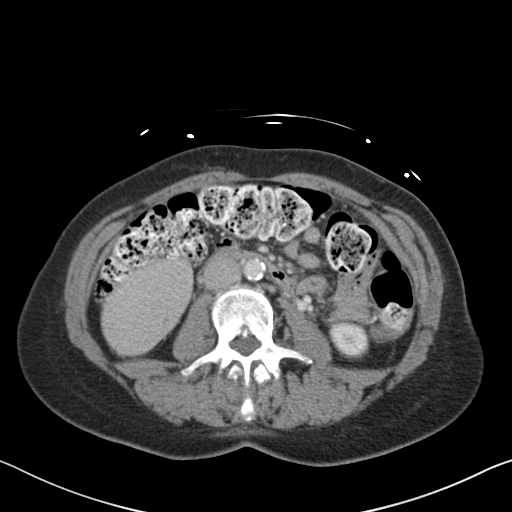
[im 61/92  soft-tissue]
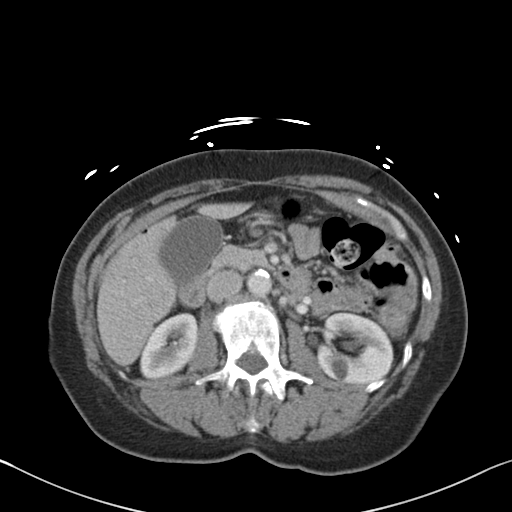
[im 61/92  bone]
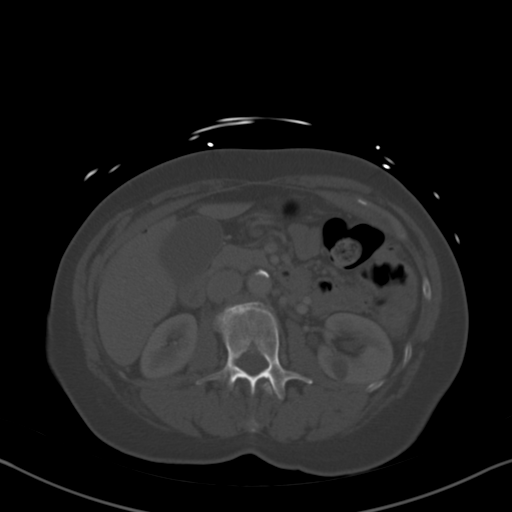
[im 67/92  soft-tissue]
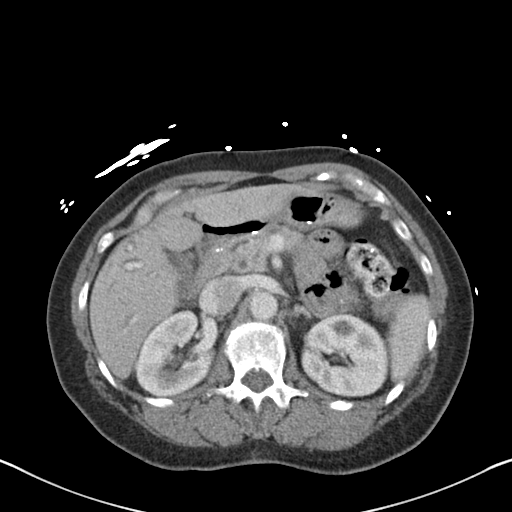
[im 73/92  soft-tissue]
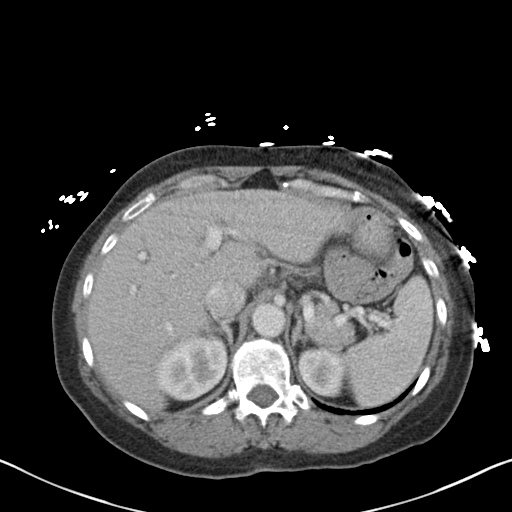
[im 79/92  soft-tissue]
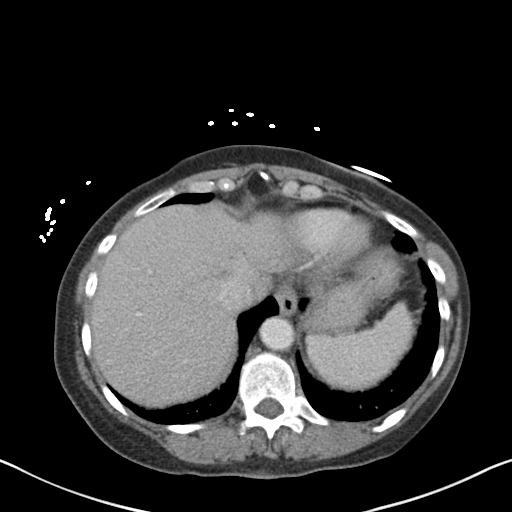
[im 85/92  soft-tissue]
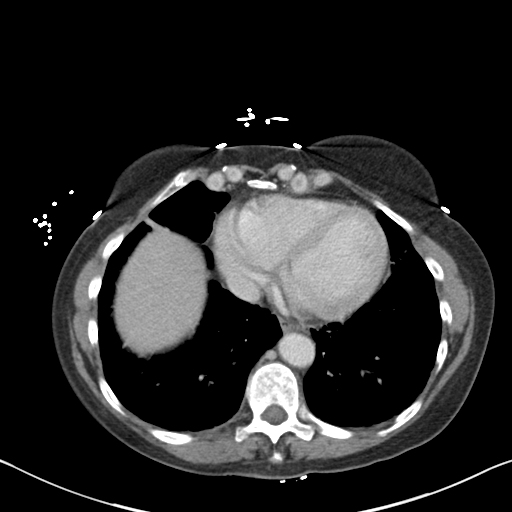

[Series 5: coronal st · coronal · 0.96mm/px · 3 of 112 slices shown]
[im 38/112  soft-tissue]
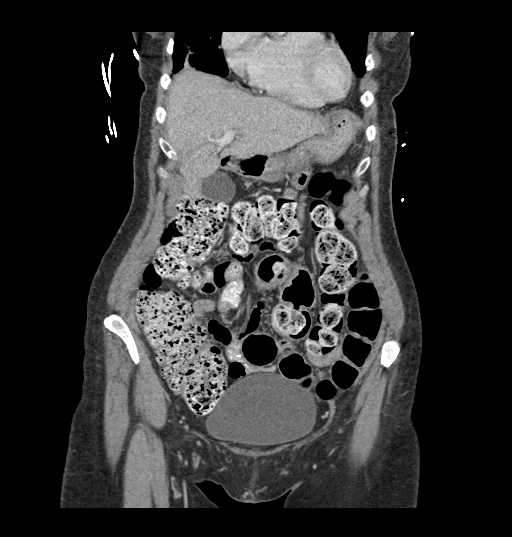
[im 50/112  soft-tissue]
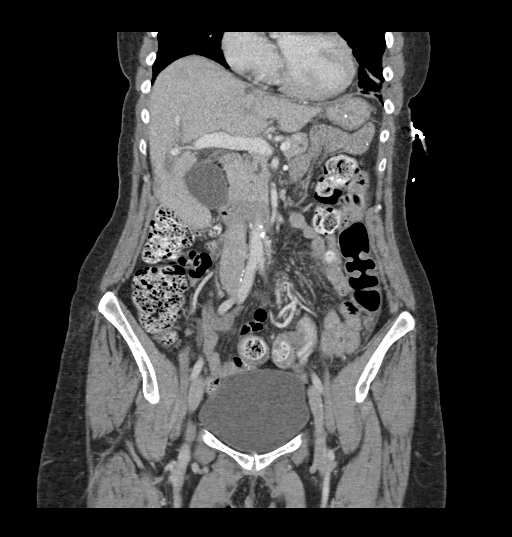
[im 62/112  soft-tissue]
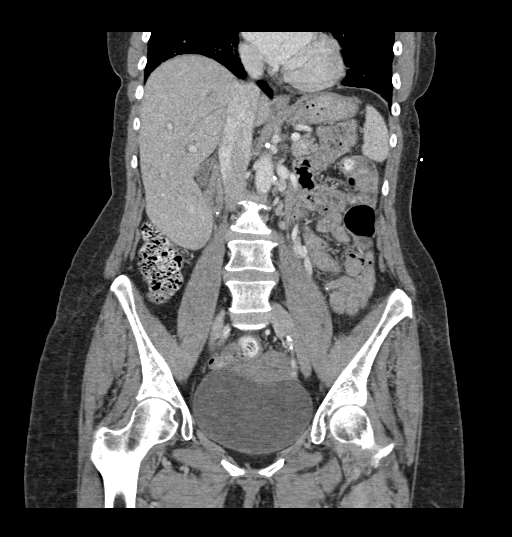

[16 of 46 positions shown; findings below may reference images not displayed]

FINDINGS: Lower chest: Bibasilar linear atelectasis/scarring. The visualized
lung bases are otherwise clear.

No intra-abdominal free air or free fluid.

Hepatobiliary: The liver is grossly unremarkable. Probable mild
periportal edema versus less likely intrahepatic biliary ductal
dilatation. Correlation with clinical exam and LFTs recommended no
calcified gallstone or pericholecystic fluid. There is no dilatation
of the common bile duct.

Pancreas: Unremarkable. No pancreatic ductal dilatation or
surrounding inflammatory changes.

Spleen: Faint ill-defined 1 cm hypodensity in the lateral inferior
pole of the spleen is not characterized.

Adrenals/Urinary Tract: The adrenal glands are unremarkable. There
is no hydronephrosis on either side. There is symmetric enhancement
and excretion of contrast by both kidneys. Small left renal cysts.
The visualized ureters and urinary bladder appear unremarkable.

Stomach/Bowel: There is no bowel obstruction or active inflammation.
Moderate amount of dense stool noted throughout the colon. The
appendix is normal.

Vascular/Lymphatic: There is moderate aortoiliac atherosclerotic
disease. The IVC is unremarkable. No portal venous gas. There is no
adenopathy.

Reproductive: The uterus is anteverted and grossly unremarkable. The
ovaries appear unremarkable as well. No pelvic mass.

Other: None

Musculoskeletal: Mild degenerative changes of the spine. No acute
osseous pathology
IMPRESSION: 1. Mild periportal edema. Correlation with clinical exam and LFTs
recommended.
2. Moderate colonic stool burden. No bowel obstruction or active
inflammation. Normal appendix.
3.  Aortic Atherosclerosis ([id number]).

## 2021-12-16 DIAGNOSIS — Z1231 Encounter for screening mammogram for malignant neoplasm of breast: Secondary | ICD-10-CM | POA: Diagnosis not present

## 2022-01-31 DIAGNOSIS — M792 Neuralgia and neuritis, unspecified: Secondary | ICD-10-CM | POA: Diagnosis not present

## 2022-01-31 DIAGNOSIS — M85859 Other specified disorders of bone density and structure, unspecified thigh: Secondary | ICD-10-CM | POA: Diagnosis not present

## 2022-01-31 DIAGNOSIS — E2839 Other primary ovarian failure: Secondary | ICD-10-CM | POA: Diagnosis not present

## 2022-01-31 DIAGNOSIS — F419 Anxiety disorder, unspecified: Secondary | ICD-10-CM | POA: Diagnosis not present

## 2022-01-31 DIAGNOSIS — K589 Irritable bowel syndrome without diarrhea: Secondary | ICD-10-CM | POA: Diagnosis not present

## 2022-01-31 DIAGNOSIS — Z0000 Encounter for general adult medical examination without abnormal findings: Secondary | ICD-10-CM | POA: Diagnosis not present

## 2022-01-31 DIAGNOSIS — G4700 Insomnia, unspecified: Secondary | ICD-10-CM | POA: Diagnosis not present

## 2022-01-31 DIAGNOSIS — E7800 Pure hypercholesterolemia, unspecified: Secondary | ICD-10-CM | POA: Diagnosis not present

## 2022-02-10 DIAGNOSIS — Z780 Asymptomatic menopausal state: Secondary | ICD-10-CM | POA: Diagnosis not present

## 2022-02-10 DIAGNOSIS — M85851 Other specified disorders of bone density and structure, right thigh: Secondary | ICD-10-CM | POA: Diagnosis not present

## 2022-02-10 DIAGNOSIS — M85852 Other specified disorders of bone density and structure, left thigh: Secondary | ICD-10-CM | POA: Diagnosis not present

## 2022-12-18 DIAGNOSIS — Z1231 Encounter for screening mammogram for malignant neoplasm of breast: Secondary | ICD-10-CM | POA: Diagnosis not present

## 2023-02-08 DIAGNOSIS — M8588 Other specified disorders of bone density and structure, other site: Secondary | ICD-10-CM | POA: Diagnosis not present

## 2023-02-08 DIAGNOSIS — Z0000 Encounter for general adult medical examination without abnormal findings: Secondary | ICD-10-CM | POA: Diagnosis not present

## 2023-02-08 DIAGNOSIS — G4700 Insomnia, unspecified: Secondary | ICD-10-CM | POA: Diagnosis not present

## 2023-02-08 DIAGNOSIS — M150 Primary generalized (osteo)arthritis: Secondary | ICD-10-CM | POA: Diagnosis not present

## 2023-02-08 DIAGNOSIS — R030 Elevated blood-pressure reading, without diagnosis of hypertension: Secondary | ICD-10-CM | POA: Diagnosis not present

## 2023-02-08 DIAGNOSIS — M85859 Other specified disorders of bone density and structure, unspecified thigh: Secondary | ICD-10-CM | POA: Diagnosis not present

## 2023-02-08 DIAGNOSIS — F320 Major depressive disorder, single episode, mild: Secondary | ICD-10-CM | POA: Diagnosis not present

## 2023-02-08 DIAGNOSIS — F419 Anxiety disorder, unspecified: Secondary | ICD-10-CM | POA: Diagnosis not present

## 2023-02-08 DIAGNOSIS — E7800 Pure hypercholesterolemia, unspecified: Secondary | ICD-10-CM | POA: Diagnosis not present

## 2023-02-08 DIAGNOSIS — K589 Irritable bowel syndrome without diarrhea: Secondary | ICD-10-CM | POA: Diagnosis not present

## 2023-02-08 DIAGNOSIS — Z1211 Encounter for screening for malignant neoplasm of colon: Secondary | ICD-10-CM | POA: Diagnosis not present

## 2023-03-13 DIAGNOSIS — F419 Anxiety disorder, unspecified: Secondary | ICD-10-CM | POA: Diagnosis not present

## 2023-03-13 DIAGNOSIS — D696 Thrombocytopenia, unspecified: Secondary | ICD-10-CM | POA: Diagnosis not present

## 2023-03-13 DIAGNOSIS — Z1211 Encounter for screening for malignant neoplasm of colon: Secondary | ICD-10-CM | POA: Diagnosis not present

## 2023-03-13 DIAGNOSIS — M150 Primary generalized (osteo)arthritis: Secondary | ICD-10-CM | POA: Diagnosis not present

## 2023-03-13 DIAGNOSIS — K589 Irritable bowel syndrome without diarrhea: Secondary | ICD-10-CM | POA: Diagnosis not present

## 2023-03-13 DIAGNOSIS — L989 Disorder of the skin and subcutaneous tissue, unspecified: Secondary | ICD-10-CM | POA: Diagnosis not present

## 2023-03-13 DIAGNOSIS — G4700 Insomnia, unspecified: Secondary | ICD-10-CM | POA: Diagnosis not present

## 2023-03-13 DIAGNOSIS — M792 Neuralgia and neuritis, unspecified: Secondary | ICD-10-CM | POA: Diagnosis not present

## 2023-03-21 DIAGNOSIS — D485 Neoplasm of uncertain behavior of skin: Secondary | ICD-10-CM | POA: Diagnosis not present

## 2023-03-21 DIAGNOSIS — R195 Other fecal abnormalities: Secondary | ICD-10-CM | POA: Diagnosis not present

## 2023-04-13 DIAGNOSIS — D696 Thrombocytopenia, unspecified: Secondary | ICD-10-CM | POA: Diagnosis not present

## 2023-05-08 ENCOUNTER — Other Ambulatory Visit: Payer: Self-pay | Admitting: Gastroenterology

## 2023-05-08 DIAGNOSIS — Z800 Family history of malignant neoplasm of digestive organs: Secondary | ICD-10-CM | POA: Diagnosis not present

## 2023-05-08 DIAGNOSIS — R195 Other fecal abnormalities: Secondary | ICD-10-CM | POA: Diagnosis not present

## 2023-05-08 DIAGNOSIS — K580 Irritable bowel syndrome with diarrhea: Secondary | ICD-10-CM | POA: Diagnosis not present

## 2023-05-08 DIAGNOSIS — D696 Thrombocytopenia, unspecified: Secondary | ICD-10-CM | POA: Diagnosis not present

## 2023-05-23 ENCOUNTER — Ambulatory Visit
Admission: RE | Admit: 2023-05-23 | Discharge: 2023-05-23 | Disposition: A | Discharge status: Home / Self Care | Payer: PPO | Source: Ambulatory Visit | Attending: Gastroenterology | Admitting: Gastroenterology

## 2023-05-23 DIAGNOSIS — D696 Thrombocytopenia, unspecified: Secondary | ICD-10-CM

## 2023-05-23 DIAGNOSIS — K8020 Calculus of gallbladder without cholecystitis without obstruction: Secondary | ICD-10-CM | POA: Diagnosis not present

## 2023-06-25 DIAGNOSIS — C8310 Mantle cell lymphoma, unspecified site: Secondary | ICD-10-CM | POA: Diagnosis not present

## 2023-06-25 DIAGNOSIS — D175 Benign lipomatous neoplasm of intra-abdominal organs: Secondary | ICD-10-CM | POA: Diagnosis not present

## 2023-06-25 DIAGNOSIS — D123 Benign neoplasm of transverse colon: Secondary | ICD-10-CM | POA: Diagnosis not present

## 2023-06-25 DIAGNOSIS — R195 Other fecal abnormalities: Secondary | ICD-10-CM | POA: Diagnosis not present

## 2023-06-25 DIAGNOSIS — D124 Benign neoplasm of descending colon: Secondary | ICD-10-CM | POA: Diagnosis not present

## 2023-06-25 DIAGNOSIS — D120 Benign neoplasm of cecum: Secondary | ICD-10-CM | POA: Diagnosis not present

## 2023-07-04 DIAGNOSIS — D124 Benign neoplasm of descending colon: Secondary | ICD-10-CM | POA: Diagnosis not present

## 2023-07-04 DIAGNOSIS — D120 Benign neoplasm of cecum: Secondary | ICD-10-CM | POA: Diagnosis not present

## 2023-07-04 DIAGNOSIS — D123 Benign neoplasm of transverse colon: Secondary | ICD-10-CM | POA: Diagnosis not present

## 2023-07-04 DIAGNOSIS — C8310 Mantle cell lymphoma, unspecified site: Secondary | ICD-10-CM | POA: Diagnosis not present

## 2023-07-05 ENCOUNTER — Other Ambulatory Visit (HOSPITAL_COMMUNITY): Payer: Self-pay | Admitting: Gastroenterology

## 2023-07-05 DIAGNOSIS — C8310 Mantle cell lymphoma, unspecified site: Secondary | ICD-10-CM

## 2023-07-12 NOTE — Progress Notes (Signed)
HEMATOLOGY/ONCOLOGY CONSULTATION NOTE  Date of Service: 07/12/2023  Patient Care Team: Clovis Riley, L.August Saucer, MD as PCP - General (Family Medicine)  CHIEF COMPLAINTS/PURPOSE OF CONSULTATION:  Evaluation and management of newly diagnosed mantle cell lymphoma.   HISTORY OF PRESENTING ILLNESS:   Courtney House is a wonderful 77 y.o. female who has been referred to [place] by Kathi Der, MD for evaluation and management of Mantle cell lymphoma.   Patient reports having a history of IBS for several years with no recent symptoms. Her bowel symptoms, including diarrhea and abdominal cramping, have been stable and managed with Hyoscyamine.    She reports stable lack of appetite over the last few years. She notes that her weight fluctuates but denies any significant weight loss.   She denies any history of polyps in the past prior to her recent routine colonoscopy with Dr. Levora Angel which showed multiple polyps which were noted to be tubular adenomas, however the mass in sigmoid colon which was reported to look like a "lipoma" on colonoscopy on biopsy turned out to show a mantle cell lymphoma.  Her previous colonoscopy was likely about 5-6 years ago, though she admits her timeline may be inaccurate.  Patient has stable bilateral leg swelling and occasional lower back pain. She denies any sudden weight loss, fever, chills, night sweats, new lumps/bumps, or swallowing issues.   She reports occasional episodes of feeling flushed with itchiness and redness in her face/head. These episodes have been occurring since a year ago. She does not tend to get acid reflux.  Patient denies any heart, lung, liver, or kidney issues. Patient is able to stay active with no significant physical limitations. She does note having neuropathy in her bilateral lower extremities, which she controls with Tramadol and Gabapentin, managed by neurology.  Patient is a never smoker and denies any alcohol use.  In regards to  her fhx, she reports that her sister has breast and colon cancer. Also, her mother has a history of brain tumor, her father has CHF, her brother has throat cancer, and her maternal uncle was diagnosed with mouth cancer and was a frequent smoker. She notes that her brother was a smoker, but likely did not have heavy use.   MEDICAL HISTORY:  Past Medical History:  Diagnosis Date   Anxiety    Carpal tunnel syndrome    Depression    Hypercholesterolemia    IBS (irritable bowel syndrome)    Insomnia    Sensory disease or syndrome 05/05/2014    SURGICAL HISTORY: Past Surgical History:  Procedure Laterality Date   CARPAL TUNNEL RELEASE Right    TUBAL LIGATION  1974    SOCIAL HISTORY: Social History   Socioeconomic History   Marital status: Widowed    Spouse name: Not on file   Number of children: 2   Years of education: 74   Highest education level: Not on file  Occupational History   Occupation: [employer]  Tobacco Use   Smoking status: Never   Smokeless tobacco: Never  Substance and Sexual Activity   Alcohol use: No    Alcohol/week: 0.0 standard drinks of alcohol   Drug use: No   Sexual activity: Not on file  Other Topics Concern   Not on file  Social History Narrative   Patient is widowed and her daughter and grandson lives with her.   Patient does drink one glass of tea daily.   Patient works on an  Theatre stage manager.   Patient has a high school  education.   Patient has two adult children.   Patient is right-handed.                  Social Determinants of Health   Financial Resource Strain: Not on file  Food Insecurity: Not on file  Transportation Needs: Not on file  Physical Activity: Not on file  Stress: Not on file  Social Connections: Not on file  Intimate Partner Violence: Not on file    FAMILY HISTORY: Family History  Problem Relation Age of Onset   Breast cancer Sister        lymph nodes   Parkinson's disease Brother    Throat cancer Brother     Macular degeneration Brother     ALLERGIES:  has No Known Allergies.  MEDICATIONS:  Current Outpatient Medications  Medication Sig Dispense Refill   calcium gluconate 650 MG tablet Take 650 mg by mouth daily.     cholecalciferol (VITAMIN D) 1000 UNITS tablet Take 1,000 Units by mouth daily.     gabapentin (NEURONTIN) 400 MG capsule TAKE ONE CAPSULE BY MOUTH IN THE MORNING AND TAKE 1 CAPSULE IN EVENING AND TAKE 2 CAPSULES AT BEDTIME 120 capsule 0   hyoscyamine (LEVSIN, ANASPAZ) 0.125 MG tablet Take 0.125 mg by mouth every 4 (four) hours as needed (abdominal pain).      LORazepam (ATIVAN) 0.5 MG tablet Take 0.5 mg by mouth daily.   0   Prenatal Vit-Fe Fumarate-FA (PRENATAL VITAMINS) 28-0.8 MG TABS Take 28 mg by mouth 1 day or 1 dose. (Patient taking differently: Take 28 mg by mouth daily. ) 30 tablet 5   temazepam (RESTORIL) 15 MG capsule Take 15 mg by mouth at bedtime as needed for sleep.     traMADol (ULTRAM) 50 MG tablet Take 50 mg by mouth 2 (two) times daily. 50mg  in am and 50mg  in pm     No current facility-administered medications for this visit.    REVIEW OF SYSTEMS:    10 Point review of Systems was done is negative except as noted above.  PHYSICAL EXAMINATION: ECOG PERFORMANCE STATUS: 1 - Symptomatic but completely ambulatory  .There were no vitals filed for this visit. There were no vitals filed for this visit. .There is no height or weight on file to calculate BMI.  GENERAL:alert, in no acute distress and comfortable SKIN: no acute rashes, no significant lesions EYES: conjunctiva are pink and non-injected, sclera anicteric OROPHARYNX: MMM, no exudates, no oropharyngeal erythema or ulceration NECK: supple, no JVD LYMPH:  no palpable lymphadenopathy in the cervical, axillary or inguinal regions LUNGS: clear to auscultation b/l with normal respiratory effort HEART: regular rate & rhythm ABDOMEN:  normoactive bowel sounds , non tender, not distended. Extremity: no pedal  edema PSYCH: alert & oriented x 3 with fluent speech NEURO: no focal motor/sensory deficits  LABORATORY DATA:  I have reviewed the data as listed  .    Latest Ref Rng & Units 01/05/2020   12:50 PM  CBC  WBC 4.0 - 10.5 K/uL 10.4   Hemoglobin 12.0 - 15.0 g/dL 84.1   Hematocrit 66.0 - 46.0 % 39.2   Platelets 150 - 400 K/uL 141     .    Latest Ref Rng & Units 01/05/2020   12:50 PM 06/26/2014   10:54 AM  CMP  Glucose 70 - 99 mg/dL 630    BUN 8 - 23 mg/dL 12    Creatinine 1.60 - 1.00 mg/dL 1.09    Sodium 323 -  145 mmol/L 140    Potassium 3.5 - 5.1 mmol/L 3.8    Chloride 98 - 111 mmol/L 105    CO2 22 - 32 mmol/L 28    Calcium 8.9 - 10.3 mg/dL 8.5    Total Protein 6.5 - 8.1 g/dL 6.8  6.4   Total Bilirubin 0.3 - 1.2 mg/dL 0.6    Alkaline Phos 38 - 126 U/L 53    AST 15 - 41 U/L 22    ALT 0 - 44 U/L 15       RADIOGRAPHIC STUDIES: I have personally reviewed the radiological images as listed and agreed with the findings in the report. No results found.  ASSESSMENT & PLAN:   77 y.o. female with:  Mantle cell lymphoma- newly diagnosed. Found as mass in the sigmoid colon on colonoscopy done to evaluate for Fhx of colon cancer and chronic IBS like symptoms. IBS Fhx of breast and colon cancer.  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 These were all tubular adenoma. Mass in distal sigmoid colon consistent with mantle cell lymphoma. -educated patient details of mantle cell lymphoma growing in the sigmoid colon and regarding this diagnosis, natural hx, workup and treatment considerations. -informed patient that mantle cell lymphoma may cause diarrhea. Patient's diarrhea symptoms may be  due to her history of IBS. -informed patient that Gabapentin may cause occasional symptoms of flush and itchiness  -discussed that treatment may involve chemotherapy and immunotherapy. Generally, surgery would not be needed as part of treatment for mantle cell lymphomas.  -will order blood tests -would recommend PET scan for further evaluation to allow [place] to stage disease -would recommend to hold CT abdomen scan ordered by [facility] at this time -answered all of patient's questions in detail  .Marland Kitchen Orders Placed This Encounter  Procedures   NM PET Image Initial (PI) Skull Base To Thigh    Standing Status:   Future    Number of Occurrences:   1    Standing Expiration Date:   07/12/2024    Order Specific Question:   If indicated for the ordered procedure, I authorize the administration of a radiopharmaceutical per Radiology protocol    Answer:   Yes    Order Specific Question:   Preferred imaging location?    Answer:   [facility]   CBC with Differential (Cancer Center Only)    Standing Status:   Future    Number of Occurrences:   1    Standing Expiration Date:   07/12/2024   CMP (Cancer Center only)    Standing Status:   Future    Number of Occurrences:   1    Standing Expiration Date:   07/12/2024   Lactate dehydrogenase    Standing Status:   Future    Number of Occurrences:   1    Standing Expiration Date:   07/12/2024   Hepatitis C antibody    Standing Status:   Future    Number of Occurrences:   1    Standing Expiration Date:   07/12/2024   Hepatitis B core antibody, total    Standing Status:   Future    Number of Occurrences:   1    Standing Expiration Date:  07/12/2024   Hepatitis B surface antigen    Standing Status:   Future    Number of Occurrences:   1    Standing Expiration Date:   07/12/2024   HIV Antibody (routine testing w rflx)    Standing Status:   Future    Number of Occurrences:   1    Standing Expiration Date:   07/12/2024   Beta 2 microglobulin, serum     Standing Status:   Future    Number of Occurrences:   1    Standing Expiration Date:   07/12/2024   Multiple Myeloma Panel (SPEP&IFE w/QIG)    Standing Status:   Future    Number of Occurrences:   1    Standing Expiration Date:   07/12/2024     FOLLOW-UP: Labs today PET/CT in 5 days RTC with Dr Candise Che in 10 days  The total time spent in the appointment was 60 minutes* .  All of the patient's questions were answered with apparent satisfaction. The patient knows to call the clinic with any problems, questions or concerns.   Wyvonnia Lora MD MS AAHIVMS [facility] Hematology/Oncology Physician [facility]  .*Total Encounter Time as defined by the Centers for Medicare and Medicaid Services includes, in addition to the face-to-face time of a patient visit (documented in the note above) non-face-to-face time: obtaining and reviewing outside history, ordering and reviewing medications, tests or procedures, care coordination (communications with other health care professionals or caregivers) and documentation in the medical record.    I,Mitra Faeizi,acting as a Neurosurgeon for Wyvonnia Lora, MD.,have documented all relevant documentation on the behalf of Wyvonnia Lora, MD,as directed by  Wyvonnia Lora, MD while in the presence of Wyvonnia Lora, MD.  .I have reviewed the above documentation for accuracy and completeness, and I agree with the above. Johney Maine MD

## 2023-07-13 ENCOUNTER — Inpatient Hospital Stay: Payer: PPO

## 2023-07-13 ENCOUNTER — Inpatient Hospital Stay: Payer: PPO | Attending: Hematology | Admitting: Hematology

## 2023-07-13 VITALS — BP 174/68 | HR 72 | Temp 98.3°F | Resp 15 | Wt 144.6 lb

## 2023-07-13 DIAGNOSIS — C8313 Mantle cell lymphoma, intra-abdominal lymph nodes: Secondary | ICD-10-CM | POA: Insufficient documentation

## 2023-07-13 DIAGNOSIS — R232 Flushing: Secondary | ICD-10-CM | POA: Diagnosis not present

## 2023-07-13 DIAGNOSIS — Z800 Family history of malignant neoplasm of digestive organs: Secondary | ICD-10-CM | POA: Diagnosis not present

## 2023-07-13 DIAGNOSIS — M545 Low back pain, unspecified: Secondary | ICD-10-CM | POA: Diagnosis not present

## 2023-07-13 DIAGNOSIS — M7989 Other specified soft tissue disorders: Secondary | ICD-10-CM | POA: Diagnosis not present

## 2023-07-13 DIAGNOSIS — Z803 Family history of malignant neoplasm of breast: Secondary | ICD-10-CM | POA: Diagnosis not present

## 2023-07-13 DIAGNOSIS — C8310 Mantle cell lymphoma, unspecified site: Secondary | ICD-10-CM

## 2023-07-13 LAB — CBC WITH DIFFERENTIAL (CANCER CENTER ONLY)
Abs Immature Granulocytes: 0.01 10*3/uL (ref 0.00–0.07)
Basophils Absolute: 0 10*3/uL (ref 0.0–0.1)
Basophils Relative: 1 %
Eosinophils Absolute: 0.1 10*3/uL (ref 0.0–0.5)
Eosinophils Relative: 2 %
HCT: 39.9 % (ref 36.0–46.0)
Hemoglobin: 12.9 g/dL (ref 12.0–15.0)
Immature Granulocytes: 0 %
Lymphocytes Relative: 18 %
Lymphs Abs: 0.7 10*3/uL (ref 0.7–4.0)
MCH: 29.8 pg (ref 26.0–34.0)
MCHC: 32.3 g/dL (ref 30.0–36.0)
MCV: 92.1 fL (ref 80.0–100.0)
Monocytes Absolute: 0.3 10*3/uL (ref 0.1–1.0)
Monocytes Relative: 8 %
Neutro Abs: 2.9 10*3/uL (ref 1.7–7.7)
Neutrophils Relative %: 71 %
Platelet Count: 96 10*3/uL — ABNORMAL LOW (ref 150–400)
RBC: 4.33 MIL/uL (ref 3.87–5.11)
RDW: 14.2 % (ref 11.5–15.5)
WBC Count: 4 10*3/uL (ref 4.0–10.5)
nRBC: 0 % (ref 0.0–0.2)

## 2023-07-13 LAB — CMP (CANCER CENTER ONLY)
ALT: 13 U/L (ref 0–44)
AST: 20 U/L (ref 15–41)
Albumin: 3.9 g/dL (ref 3.5–5.0)
Alkaline Phosphatase: 55 U/L (ref 38–126)
Anion gap: 6 (ref 5–15)
BUN: 10 mg/dL (ref 8–23)
CO2: 30 mmol/L (ref 22–32)
Calcium: 8.9 mg/dL (ref 8.9–10.3)
Chloride: 106 mmol/L (ref 98–111)
Creatinine: 0.72 mg/dL (ref 0.44–1.00)
GFR, Estimated: >60 mL/min (ref >60)
Glucose, Bld: 79 mg/dL (ref 70–99)
Potassium: 4.1 mmol/L (ref 3.5–5.1)
Sodium: 142 mmol/L (ref 135–145)
Total Bilirubin: 0.5 mg/dL (ref 0.3–1.2)
Total Protein: 7 g/dL (ref 6.5–8.1)

## 2023-07-13 LAB — HEPATITIS C ANTIBODY: HCV Ab: NONREACTIVE

## 2023-07-13 LAB — HEPATITIS B CORE ANTIBODY, TOTAL: Hep B Core Total Ab: NONREACTIVE

## 2023-07-13 LAB — HIV ANTIBODY (ROUTINE TESTING W REFLEX): HIV Screen 4th Generation wRfx: NONREACTIVE

## 2023-07-13 LAB — HEPATITIS B SURFACE ANTIGEN: Hepatitis B Surface Ag: NONREACTIVE

## 2023-07-13 LAB — LACTATE DEHYDROGENASE: LDH: 165 U/L (ref 98–192)

## 2023-07-14 LAB — BETA 2 MICROGLOBULIN, SERUM: Beta-2 Microglobulin: 2.2 mg/L (ref 0.6–2.4)

## 2023-07-17 ENCOUNTER — Ambulatory Visit (HOSPITAL_COMMUNITY)
Admission: RE | Admit: 2023-07-17 | Discharge: 2023-07-17 | Disposition: A | Discharge status: Home / Self Care | Payer: PPO | Source: Ambulatory Visit | Attending: Hematology | Admitting: Hematology

## 2023-07-17 DIAGNOSIS — C8310 Mantle cell lymphoma, unspecified site: Secondary | ICD-10-CM | POA: Diagnosis not present

## 2023-07-17 LAB — GLUCOSE, CAPILLARY: Glucose-Capillary: 90 mg/dL (ref 70–99)

## 2023-07-17 MED ORDER — FLUDEOXYGLUCOSE F - 18 (FDG) INJECTION
7.0000 | Freq: Once | INTRAVENOUS | Status: AC | PRN
  Administered 2023-07-17: 7.5 via INTRAVENOUS

## 2023-07-18 ENCOUNTER — Ambulatory Visit (HOSPITAL_BASED_OUTPATIENT_CLINIC_OR_DEPARTMENT_OTHER): Payer: PPO

## 2023-07-18 ENCOUNTER — Encounter (HOSPITAL_BASED_OUTPATIENT_CLINIC_OR_DEPARTMENT_OTHER): Payer: Self-pay

## 2023-07-19 LAB — MULTIPLE MYELOMA PANEL, SERUM
Albumin SerPl Elph-Mcnc: 3.8 g/dL (ref 2.9–4.4)
Albumin/Glob SerPl: 1.5 (ref 0.7–1.7)
Alpha 1: 0.2 g/dL (ref 0.0–0.4)
Alpha2 Glob SerPl Elph-Mcnc: 0.6 g/dL (ref 0.4–1.0)
B-Globulin SerPl Elph-Mcnc: 0.8 g/dL (ref 0.7–1.3)
Gamma Glob SerPl Elph-Mcnc: 1 g/dL (ref 0.4–1.8)
Globulin, Total: 2.7 g/dL (ref 2.2–3.9)
IgA: 123 mg/dL (ref 64–422)
IgG (Immunoglobin G), Serum: 945 mg/dL (ref 586–1602)
IgM (Immunoglobulin M), Srm: 309 mg/dL — ABNORMAL HIGH (ref 26–217)
M Protein SerPl Elph-Mcnc: 0.4 g/dL — ABNORMAL HIGH
Total Protein ELP: 6.5 g/dL (ref 6.0–8.5)

## 2023-08-13 DIAGNOSIS — G4700 Insomnia, unspecified: Secondary | ICD-10-CM | POA: Diagnosis not present

## 2023-08-13 DIAGNOSIS — F419 Anxiety disorder, unspecified: Secondary | ICD-10-CM | POA: Diagnosis not present

## 2023-08-13 DIAGNOSIS — C8310 Mantle cell lymphoma, unspecified site: Secondary | ICD-10-CM | POA: Diagnosis not present

## 2023-08-13 DIAGNOSIS — M792 Neuralgia and neuritis, unspecified: Secondary | ICD-10-CM | POA: Diagnosis not present

## 2023-08-13 DIAGNOSIS — M85859 Other specified disorders of bone density and structure, unspecified thigh: Secondary | ICD-10-CM | POA: Diagnosis not present

## 2023-08-17 ENCOUNTER — Inpatient Hospital Stay: Payer: PPO | Attending: Hematology | Admitting: Hematology

## 2023-08-17 VITALS — BP 168/76 | HR 82 | Temp 97.3°F | Resp 17 | Wt 143.1 lb

## 2023-08-17 DIAGNOSIS — C8313 Mantle cell lymphoma, intra-abdominal lymph nodes: Secondary | ICD-10-CM | POA: Diagnosis not present

## 2023-08-17 DIAGNOSIS — M7989 Other specified soft tissue disorders: Secondary | ICD-10-CM | POA: Diagnosis not present

## 2023-08-17 DIAGNOSIS — R630 Anorexia: Secondary | ICD-10-CM | POA: Insufficient documentation

## 2023-08-17 DIAGNOSIS — M545 Low back pain, unspecified: Secondary | ICD-10-CM | POA: Diagnosis not present

## 2023-08-17 DIAGNOSIS — Z800 Family history of malignant neoplasm of digestive organs: Secondary | ICD-10-CM | POA: Diagnosis not present

## 2023-08-17 DIAGNOSIS — G629 Polyneuropathy, unspecified: Secondary | ICD-10-CM | POA: Diagnosis not present

## 2023-08-17 DIAGNOSIS — C8310 Mantle cell lymphoma, unspecified site: Secondary | ICD-10-CM | POA: Diagnosis not present

## 2023-08-17 DIAGNOSIS — R21 Rash and other nonspecific skin eruption: Secondary | ICD-10-CM | POA: Diagnosis not present

## 2023-08-17 DIAGNOSIS — Z5111 Encounter for antineoplastic chemotherapy: Secondary | ICD-10-CM | POA: Diagnosis not present

## 2023-08-17 DIAGNOSIS — K580 Irritable bowel syndrome with diarrhea: Secondary | ICD-10-CM | POA: Diagnosis not present

## 2023-08-17 DIAGNOSIS — Z803 Family history of malignant neoplasm of breast: Secondary | ICD-10-CM | POA: Diagnosis not present

## 2023-08-17 DIAGNOSIS — Z5112 Encounter for antineoplastic immunotherapy: Secondary | ICD-10-CM | POA: Insufficient documentation

## 2023-08-17 NOTE — Progress Notes (Signed)
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   HISTORY OF PRESENTING ILLNESS:   Courtney House is a wonderful 77 y.o. female who has been referred to [place] by Kathi Der, MD for evaluation and management of Mantle cell lymphoma.   Patient reports having a history of IBS for several years with no recent symptoms. Her bowel symptoms, including diarrhea and abdominal cramping, have been stable and managed with Hyoscyamine.    She reports stable lack of appetite over the last few years. She notes that her weight fluctuates but denies any significant weight loss.   She denies any history of polyps in the past prior to her recent routine colonoscopy with Dr. Levora Angel which showed multiple polyps which were noted to be tubular adenomas, however the mass in sigmoid colon which was reported to look like a "lipoma" on colonoscopy on biopsy turned out to show a mantle cell lymphoma.  Her previous colonoscopy was likely about 5-6 years ago, though she admits her timeline may be inaccurate.  Patient has stable bilateral leg swelling and occasional lower back pain. She denies any sudden weight loss, fever, chills, night sweats, new lumps/bumps, or swallowing issues.   She reports occasional episodes of feeling flushed with itchiness and redness in her face/head. These episodes have been occurring since a year ago. She does not tend to get acid reflux.  Patient denies any heart, lung, liver, or kidney issues. Patient is able to stay active with no significant physical limitations. She does note having neuropathy in her bilateral lower extremities, which she controls with Tramadol and Gabapentin, managed by neurology.  Patient is a never smoker and denies any alcohol use.  In regards to  her fhx, she reports that her sister has breast and colon cancer. Also, her mother has a history of brain tumor, her father has CHF, her brother has throat cancer, and her maternal uncle was diagnosed with mouth cancer and was a frequent smoker. She notes that her brother was a smoker, but likely did not have heavy use.   INTERVAL HISTORY:  Courtney House is a 77 y.o. female here for continued evaluation and management of mantle cell lymphoma. She was initially seen by me on 07/13/2023 and complained of lack of appetite for several years, mild weight fluctuation, stable bilateral leg swelling, occasional lower back pain, occasional episodes of feeling flushed with itchiness and redness in face/head, and neuropathy in bilateral lower extremities.   Today, she is accompanied by her daughter. Patient reports having skin rashes which are occasionally itchy. She reports having two red spots in her right foot. Patient also reports having raised red lesions once in a while. She denies any known triggers such as certain food items or chemicals. Patient reports that her head sometimes itches as well.    -red discoloration in feet may be from increased pressure in the veins, and varicose veins may cause it to be more prominent -educated patient on stasis dermatitis   She does not feel good most of time   3 bm this morning. Not diarrhea.  Frequently in morninfs Not in afternon No blood or mucus in stools  No lumps bump Change in breathing New abdoinal pain  Feet swelling stable     -PET scan on 07/17/2023 did show more significant disease rather than only  in the colon. Findings include small lymph nodes in the neck; no obvious lymph nodes in the axillaries, chest, or lungs. She does have several lymph nodes in the retroperitoneal, mesenteric and pelvis. Findings do suggest at least stage 4 disease, both sides of diaphragm with involvement of the bowels  She does report bowel habit  frequency -educated patient that the sigmoid colon may send signal to bowel which may cause bathroom frequency -could conceivably give sensation that she needs to use bathroom  Itching and rashes may be from cytokines  Has stasis dermatisis too though    -Discussed lab results from 07/13/2023 in detail with patient. CBC showed WBC of 4.0K, hemoglobin of 12.9, and platelets of 96K. -no anemia -platelets slightly low. Not concerning for bleeidng issues.  -do not recommend bone marrow biopsy at this time -CMP stable -antibody tesitng - makes small amount of abnormal antibody -LDH not elevated -hepitis and HIV testing normal     -CBC did show thrombocytopenia which could be from mantle cell lymphoma either directly or indirectly  -educated patient on mantle-cell lymphoma in detail  -answered all of patient   -mantle cell lymphoma can be variable in their behavior. Discussed details of spectrum of mantle cell lymphomas. Guardant variety mantle cell lymphoma  -educated very treatable, though it would not be curable -std treat combo of single agent chemotherapy Bendamustine, combined with targeted antibody, called Rituxan.   -no need for port- placement -two days of treatment every 4 weeks, for 4-6 cycles  -slightly dose reduced start maybe -depending on her response after 3 cycles of treatment, how she tolerates treatment and how she responds. If she is in complete remission, watch or just antibody  -will setup patient or an education session to discuss details of treatment further  -answered all of patient's and her daughter's question i  -discussed potential side affects risk of allergic reactoins with antibody  She does not tend to have medication allergies, and denies any other allergic tendencies.   -will monitor blood counts with chemotherapy to determine dosing of treatment -may be role transfusion support or shot to boost WBCs depending on labs  -She may 10-14 days  into fatigue  -will 1 level below standard dose    Patient denies any significant weight loss  Right swelling foot sometimes  She takes Hyoscyamine 0.125 MG up to 3x a day to manage abdominal pain. Patient has no discomfort in her abdomen at this time. Patient reports that she is trying to take her vitamins    Trying to take vitamins regularly  She complains of fatigue  When taking tramadol once a day, does mildly improve leg discomfort. She also takes gabapentin to manage neuropathy in lower extremities.  Takes gaba this morning, 6 pm, and two before bed.   -gabapentin may be causing sleepiness. Genreally gaba towrds bedtime  -liquid version of B complex -recommend 2000 units vit D  She issues sometimes swallowing tablets.   -recommend taking vitamin B complex regulalry  Lack of appetite for 2-3 years.  But now worse. 50% compared to usual 2 meals a day No nausea,   -recommend supplements boost/ensure between meals -anticipate that treatment will improve appetite if related to lymphoma  She reports that her husband does have prostate cancer.   -premedications may cause drowsiness, and would recommend her not to drive with treatment. Medical transportation or family.  Would need to see how patient responds to treatment.   -advised patient to drink at least 2L water  day, eat as well as she can, and walk 20-30 minutes daily to reduce potential fatigue from chemotherapy    She does drink water regularly.    (Not imbolent mantle cell)  -there involvement of lymph nodes and other organs  MEDICAL HISTORY:  Past Medical History:  Diagnosis Date   Anxiety    Carpal tunnel syndrome    Depression    Hypercholesterolemia    IBS (irritable bowel syndrome)    Insomnia    Sensory disease or syndrome 05/05/2014    SURGICAL HISTORY: Past Surgical History:  Procedure Laterality Date   CARPAL TUNNEL RELEASE Right    TUBAL LIGATION  1974    SOCIAL HISTORY: Social  History   Socioeconomic History   Marital status: Widowed    Spouse name: Not on file   Number of children: 2   Years of education: 81   Highest education level: Not on file  Occupational History   Occupation: [employer]  Tobacco Use   Smoking status: Never   Smokeless tobacco: Never  Substance and Sexual Activity   Alcohol use: No    Alcohol/week: 0.0 standard drinks of alcohol   Drug use: No   Sexual activity: Not on file  Other Topics Concern   Not on file  Social History Narrative   Patient is widowed and her daughter and grandson lives with her.   Patient does drink one glass of tea daily.   Patient works on an  Theatre stage manager.   Patient has a high school education.   Patient has two adult children.   Patient is right-handed.                  Social Determinants of Health   Financial Resource Strain: Not on file  Food Insecurity: Not on file  Transportation Needs: Not on file  Physical Activity: Not on file  Stress: Not on file  Social Connections: Not on file  Intimate Partner Violence: Not on file    FAMILY HISTORY: Family History  Problem Relation Age of Onset   Breast cancer Sister        lymph nodes   Parkinson's disease Brother    Throat cancer Brother    Macular degeneration Brother     ALLERGIES:  has No Known Allergies.  MEDICATIONS:  Current Outpatient Medications  Medication Sig Dispense Refill   calcium gluconate 650 MG tablet Take 650 mg by mouth daily.     cholecalciferol (VITAMIN D) 1000 UNITS tablet Take 1,000 Units by mouth daily.     gabapentin (NEURONTIN) 400 MG capsule TAKE ONE CAPSULE BY MOUTH IN THE MORNING AND TAKE 1 CAPSULE IN EVENING AND TAKE 2 CAPSULES AT BEDTIME 120 capsule 0   hyoscyamine (LEVSIN, ANASPAZ) 0.125 MG tablet Take 0.125 mg by mouth every 4 (four) hours as needed (abdominal pain).      LORazepam (ATIVAN) 0.5 MG tablet Take 0.5 mg by mouth daily.  (Patient not taking: Reported on 07/13/2023)  0   Prenatal Vit-Fe  Fumarate-FA (PRENATAL VITAMINS) 28-0.8 MG TABS Take 28 mg by mouth 1 day or 1 dose. (Patient taking differently: Take 28 mg by mouth daily.) 30 tablet 5   temazepam (RESTORIL) 15 MG capsule Take 15 mg by mouth at bedtime as needed for sleep.     traMADol (ULTRAM) 50 MG tablet Take 50 mg by mouth 2 (two) times daily. 50mg  in am and 50mg  in pm     No current facility-administered medications for this visit.  REVIEW OF SYSTEMS:    10 Point review of Systems was done is negative except as noted above.   PHYSICAL EXAMINATION: ECOG PERFORMANCE STATUS: 1 - Symptomatic but completely ambulatory  .There were no vitals filed for this visit. There were no vitals filed for this visit. .There is no height or weight on file to calculate BMI.  GENERAL:alert, in no acute distress and comfortable SKIN: no acute rashes, no significant lesions EYES: conjunctiva are pink and non-injected, sclera anicteric OROPHARYNX: MMM, no exudates, no oropharyngeal erythema or ulceration NECK: supple, no JVD LYMPH:  no palpable lymphadenopathy in the cervical, axillary or inguinal regions LUNGS: clear to auscultation b/l with normal respiratory effort HEART: regular rate & rhythm ABDOMEN:  normoactive bowel sounds , non tender, not distended. Extremity: no pedal edema PSYCH: alert & oriented x 3 with fluent speech NEURO: no focal motor/sensory deficits   LABORATORY DATA:  I have reviewed the data as listed  .    Latest Ref Rng & Units 07/13/2023   12:37 PM 01/05/2020   12:50 PM  CBC  WBC 4.0 - 10.5 K/uL 4.0  10.4   Hemoglobin 12.0 - 15.0 g/dL 40.9  81.1   Hematocrit 36.0 - 46.0 % 39.9  39.2   Platelets 150 - 400 K/uL 96  141     .    Latest Ref Rng & Units 07/13/2023   12:37 PM 01/05/2020   12:50 PM 06/26/2014   10:54 AM  CMP  Glucose 70 - 99 mg/dL 79  914    BUN 8 - 23 mg/dL 10  12    Creatinine 7.82 - 1.00 mg/dL 9.56  2.13    Sodium 086 - 145 mmol/L 142  140    Potassium 3.5 - 5.1 mmol/L 4.1   3.8    Chloride 98 - 111 mmol/L 106  105    CO2 22 - 32 mmol/L 30  28    Calcium 8.9 - 10.3 mg/dL 8.9  8.5    Total Protein 6.5 - 8.1 g/dL 7.0  6.8  6.4   Total Bilirubin 0.3 - 1.2 mg/dL 0.5  0.6    Alkaline Phos 38 - 126 U/L 55  53    AST 15 - 41 U/L 20  22    ALT 0 - 44 U/L 13  15       RADIOGRAPHIC STUDIES: I have personally reviewed the radiological images as listed and agreed with the findings in the report. No results found.  ASSESSMENT & PLAN:   76 y.o. female with:  Mantle cell lymphoma- newly diagnosed. Found as mass in the sigmoid colon on colonoscopy done to evaluate for Fhx of colon cancer and chronic IBS like symptoms. IBS Fhx of breast and colon cancer.  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 These were all tubular adenoma. Mass in distal sigmoid colon consistent with mantle cell lymphoma. -educated patient details of mantle cell lymphoma growing in the sigmoid colon and regarding this diagnosis, natural hx, workup and treatment considerations. -informed patient that mantle cell lymphoma may cause diarrhea. Patient's diarrhea symptoms may be due to her history of IBS. -informed patient that Gabapentin may cause occasional symptoms of flush and itchiness  -discussed that  treatment may involve chemotherapy and immunotherapy. Generally, surgery would not be needed as part of treatment for mantle cell lymphomas.  -will order blood tests -would recommend PET scan for further evaluation to allow [place] to stage disease -would recommend to hold CT abdomen scan ordered by [facility] at this time -answered all of patient's questions in detail  FOLLOW-UP: ***  The total time  spent in the appointment was *** minutes* .  All of the patient's questions were answered with apparent satisfaction. The patient knows to call the clinic with any problems, questions or concerns.   Wyvonnia Lora MD MS AAHIVMS [facility] Hematology/Oncology Physician [facility]  .*Total Encounter Time as defined by the Centers for Medicare and Medicaid Services includes, in addition to the face-to-face time of a patient visit (documented in the note above) non-face-to-face time: obtaining and reviewing outside history, ordering and reviewing medications, tests or procedures, care coordination (communications with other health care professionals or caregivers) and documentation in the medical record.    I,Mitra Faeizi,acting as a Neurosurgeon for Wyvonnia Lora, MD.,have documented all relevant documentation on the behalf of Wyvonnia Lora, MD,as directed by  Wyvonnia Lora, MD while in the presence of Wyvonnia Lora, MD.  ***

## 2023-08-24 ENCOUNTER — Telehealth: Payer: Self-pay

## 2023-08-24 ENCOUNTER — Encounter: Payer: Self-pay | Admitting: Hematology

## 2023-08-24 DIAGNOSIS — C8310 Mantle cell lymphoma, unspecified site: Secondary | ICD-10-CM | POA: Insufficient documentation

## 2023-08-24 MED ORDER — DEXAMETHASONE 4 MG PO TABS
8.0000 mg | ORAL_TABLET | Freq: Every day | ORAL | 1 refills | Status: DC
Start: 2023-08-24 — End: 2024-01-16

## 2023-08-24 MED ORDER — ACYCLOVIR 400 MG PO TABS: 400.0000 mg | ORAL_TABLET | Freq: Every day | ORAL | 3 refills | Status: DC

## 2023-08-24 MED ORDER — PROCHLORPERAZINE MALEATE 10 MG PO TABS: 10.0000 mg | ORAL_TABLET | Freq: Four times a day (QID) | ORAL | 1 refills | Status: AC | PRN

## 2023-08-24 MED ORDER — ONDANSETRON HCL 8 MG PO TABS: 8.0000 mg | ORAL_TABLET | Freq: Three times a day (TID) | ORAL | 1 refills | Status: AC | PRN

## 2023-08-24 MED ORDER — ALLOPURINOL 300 MG PO TABS: 150.0000 mg | ORAL_TABLET | Freq: Every day | ORAL | 0 refills | Status: DC

## 2023-08-24 NOTE — Addendum Note (Signed)
Addended by: Wyvonnia Lora on: 08/24/2023 12:09 AM   Modules accepted: Orders

## 2023-08-24 NOTE — Telephone Encounter (Signed)
Notified Patient by voicemail of prior authorization approval for Ondansetron 8 mg tablets. Medication is approved through 11/12/2024. No other needs or concerns noted at this time.

## 2023-08-24 NOTE — Progress Notes (Signed)
 START ON PATHWAY REGIMEN - Lymphoma and CLL     A cycle is every 28 days:     Rituximab -xxxx      Bendamustine    **Always confirm dose/schedule in your pharmacy ordering system**  Patient Characteristics: Mantle Cell Lymphoma, First Line, Aggressive Disease or Treatment Indicated, Stage II - IV, Not a Transplant Candidate Disease Type: Not Applicable Disease Type: Mantle Cell Lymphoma Disease Type: Not Applicable Line of Therapy: First Line Was TP53 Mutation Testing Completed<= No, TP53 Mutation Testing Not Completed Patient Characteristics: Not a Transplant Candidate Intent of Therapy: Non-Curative / Palliative Intent, Discussed with Patient

## 2023-08-25 ENCOUNTER — Other Ambulatory Visit: Payer: Self-pay

## 2023-08-28 ENCOUNTER — Inpatient Hospital Stay: Payer: PPO

## 2023-08-29 MED FILL — Dexamethasone Sodium Phosphate Inj 100 MG/10ML: INTRAMUSCULAR | Qty: 1 | Status: AC

## 2023-08-29 NOTE — Progress Notes (Incomplete)
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   HISTORY OF PRESENTING ILLNESS:   Courtney House is a wonderful 77 y.o. female who has been referred to [place] by Kathi Der, MD for evaluation and management of Mantle cell lymphoma.   Patient reports having a history of IBS for several years with no recent symptoms. Her bowel symptoms, including diarrhea and abdominal cramping, have been stable and managed with Hyoscyamine.    She reports stable lack of appetite over the last few years. She notes that her weight fluctuates but denies any significant weight loss.   She denies any history of polyps in the past prior to her recent routine colonoscopy with Dr. Levora Angel which showed multiple polyps which were noted to be tubular adenomas, however the mass in sigmoid colon which was reported to look like a "lipoma" on colonoscopy on biopsy turned out to show a mantle cell lymphoma.  Her previous colonoscopy was likely about 5-6 years ago, though she admits her timeline may be inaccurate.  Patient has stable bilateral leg swelling and occasional lower back pain. She denies any sudden weight loss, fever, chills, night sweats, new lumps/bumps, or swallowing issues.   She reports occasional episodes of feeling flushed with itchiness and redness in her face/head. These episodes have been occurring since a year ago. She does not tend to get acid reflux.  Patient denies any heart, lung, liver, or kidney issues. Patient is able to stay active with no significant physical limitations. She does note having neuropathy in her bilateral lower extremities, which she controls with Tramadol and Gabapentin, managed by neurology.  Patient is a never smoker and denies any alcohol use.  In regards to her  fhx, she reports that her sister has breast and colon cancer. Also, her mother has a history of brain tumor, her father has CHF, her brother has throat cancer, and her maternal uncle was diagnosed with mouth cancer and was a frequent smoker. She notes that her brother was a smoker, but likely did not have heavy use.   INTERVAL HISTORY:  Courtney House is a 77 y.o. female here for continued evaluation and management of mantle cell lymphoma. She was last seen by me on 08/17/2023 and reported skin rashes with occasional itchiness, two red lesions in right foot, right foot swelling sometimes, head itchiness, fatigue, frequent bowel habits, stable feet swelling, and worsened lack of appetite.   Today,  -Discussed lab results on 08/30/2023 in detail with patient. CBC showed WBC of ***K, hemoglobin of ***, and platelets of ***K. -     She takes Hyoscyamine 0.125 MG up to 3x a day to manage abdominal pain.    MEDICAL HISTORY:  Past Medical History:  Diagnosis Date   Anxiety    Carpal tunnel syndrome    Depression    Hypercholesterolemia    IBS (irritable bowel syndrome)    Insomnia    Sensory disease or syndrome 05/05/2014    SURGICAL HISTORY: Past Surgical History:  Procedure Laterality Date   CARPAL TUNNEL RELEASE Right    TUBAL LIGATION  1974    SOCIAL HISTORY: Social History   Socioeconomic History   Marital status: Widowed    Spouse name: Not on file   Number of children: 2   Years of education: 12   Highest education  level: Not on file  Occupational History   Occupation: [employer]  Tobacco Use   Smoking status: Never   Smokeless tobacco: Never  Substance and Sexual Activity   Alcohol use: No    Alcohol/week: 0.0 standard drinks of alcohol   Drug use: No   Sexual activity: Not on file  Other Topics Concern   Not on file  Social History Narrative   Patient is widowed and her daughter and grandson lives with her.   Patient does drink one glass of tea daily.    Patient works on an  Theatre stage manager.   Patient has a high school education.   Patient has two adult children.   Patient is right-handed.                  Social Determinants of Health   Financial Resource Strain: Not on file  Food Insecurity: Not on file  Transportation Needs: Not on file  Physical Activity: Not on file  Stress: Not on file  Social Connections: Not on file  Intimate Partner Violence: Not on file    FAMILY HISTORY: Family History  Problem Relation Age of Onset   Breast cancer Sister        lymph nodes   Parkinson's disease Brother    Throat cancer Brother    Macular degeneration Brother     ALLERGIES:  has No Known Allergies.  MEDICATIONS:  Current Outpatient Medications  Medication Sig Dispense Refill   acyclovir (ZOVIRAX) 400 MG tablet Take 1 tablet (400 mg total) by mouth daily. 30 tablet 3   allopurinol (ZYLOPRIM) 300 MG tablet Take 0.5 tablets (150 mg total) by mouth daily. 30 tablet 0   calcium gluconate 650 MG tablet Take 650 mg by mouth daily.     cholecalciferol (VITAMIN D) 1000 UNITS tablet Take 1,000 Units by mouth daily.     dexamethasone (DECADRON) 4 MG tablet Take 2 tablets (8 mg total) by mouth daily. Start the day after bendamustine chemotherapy for 2 days. Take with food. 30 tablet 1   gabapentin (NEURONTIN) 400 MG capsule TAKE ONE CAPSULE BY MOUTH IN THE MORNING AND TAKE 1 CAPSULE IN EVENING AND TAKE 2 CAPSULES AT BEDTIME 120 capsule 0   hyoscyamine (LEVSIN, ANASPAZ) 0.125 MG tablet Take 0.125 mg by mouth every 4 (four) hours as needed (abdominal pain).      LORazepam (ATIVAN) 0.5 MG tablet Take 0.5 mg by mouth daily.  (Patient not taking: Reported on 07/13/2023)  0   ondansetron (ZOFRAN) 8 MG tablet Take 1 tablet (8 mg total) by mouth every 8 (eight) hours as needed for nausea or vomiting. Start on the third day after chemotherapy. 30 tablet 1   Prenatal Vit-Fe Fumarate-FA (PRENATAL VITAMINS) 28-0.8 MG TABS Take 28 mg by mouth 1 day or 1  dose. (Patient taking differently: Take 28 mg by mouth daily.) 30 tablet 5   prochlorperazine (COMPAZINE) 10 MG tablet Take 1 tablet (10 mg total) by mouth every 6 (six) hours as needed for nausea or vomiting. 30 tablet 1   temazepam (RESTORIL) 15 MG capsule Take 15 mg by mouth at bedtime as needed for sleep.     traMADol (ULTRAM) 50 MG tablet Take 50 mg by mouth 2 (two) times daily. 50mg  in am and 50mg  in pm     No current facility-administered medications for this visit.    REVIEW OF SYSTEMS:    10 Point review of Systems was done is negative except as noted above.  PHYSICAL EXAMINATION: ECOG PERFORMANCE STATUS: 1 - Symptomatic but completely ambulatory  . There were no vitals filed for this visit.  There were no vitals filed for this visit.  .There is no height or weight on file to calculate BMI.   GENERAL:alert, in no acute distress and comfortable SKIN: no acute rashes, no significant lesions EYES: conjunctiva are pink and non-injected, sclera anicteric OROPHARYNX: MMM, no exudates, no oropharyngeal erythema or ulceration NECK: supple, no JVD LYMPH:  no palpable lymphadenopathy in the cervical, axillary or inguinal regions LUNGS: clear to auscultation b/l with normal respiratory effort HEART: regular rate & rhythm ABDOMEN:  normoactive bowel sounds , non tender, not distended. Extremity: no pedal edema PSYCH: alert & oriented x 3 with fluent speech NEURO: no focal motor/sensory deficits   LABORATORY DATA:  I have reviewed the data as listed  .    Latest Ref Rng & Units 07/13/2023   12:37 PM 01/05/2020   12:50 PM  CBC  WBC 4.0 - 10.5 K/uL 4.0  10.4   Hemoglobin 12.0 - 15.0 g/dL 16.1  09.6   Hematocrit 36.0 - 46.0 % 39.9  39.2   Platelets 150 - 400 K/uL 96  141     .    Latest Ref Rng & Units 07/13/2023   12:37 PM 01/05/2020   12:50 PM 06/26/2014   10:54 AM  CMP  Glucose 70 - 99 mg/dL 79  045    BUN 8 - 23 mg/dL 10  12    Creatinine 4.09 - 1.00 mg/dL 8.11   9.14    Sodium 782 - 145 mmol/L 142  140    Potassium 3.5 - 5.1 mmol/L 4.1  3.8    Chloride 98 - 111 mmol/L 106  105    CO2 22 - 32 mmol/L 30  28    Calcium 8.9 - 10.3 mg/dL 8.9  8.5    Total Protein 6.5 - 8.1 g/dL 7.0  6.8  6.4   Total Bilirubin 0.3 - 1.2 mg/dL 0.5  0.6    Alkaline Phos 38 - 126 U/L 55  53    AST 15 - 41 U/L 20  22    ALT 0 - 44 U/L 13  15       RADIOGRAPHIC STUDIES: I have personally reviewed the radiological images as listed and agreed with the findings in the report. No results found.  ASSESSMENT & PLAN:   77 y.o. female with:  Mantle cell lymphoma- newly diagnosed. Found as mass in the sigmoid colon on colonoscopy done to evaluate for Fhx of colon cancer and chronic IBS like symptoms. IBS Fhx of breast and colon cancer.  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 CBC showed WBC of 4.0K, hemoglobin of 12.9, and platelets of 96K. -CBC did show thrombocytopenia which could be from mantle cell lymphoma either directly or indirectly -no anemia -platelets slightly low. Not concerning for bleeidng issues.  -do not recommend bone marrow biopsy at this time -CMP stable -antibody tesitng - makes small amount of abnormal antibody -LDH not elevated -hepitis and HIV testing normal -PET scan on 07/17/2023 did show more significant disease rather than only in the colon. Findings include small lymph nodes in the neck; no obvious lymph nodes in the axillaries, chest, or lungs. She does have several lymph nodes in the retroperitoneal, mesenteric and pelvis. Findings do suggest at least stage 4 disease, as there  are findings on both sides of the diaphragm with involvement of the bowels -educated patient on mantle-cell lymphoma in detail -mantle cell lymphoma can be variable in their behavior. Discussed details of spectrum of mantle cell lymphomas. Guardant variety  mantle cell lymphoma -educated patient that the sigmoid colon may send a signal to the bowel which could conceivably give her the sensation that she needs to use bathroom, causing frequent bowel habits -educated patient that Itching and rashes may be from cytokine release -educated patient that her condition is very treatable, though it would not be curable -standard treatment would involve a combination of single-agent chemotherapy, Bendamustine, combined with targeted antibody, Rituxan. She would receive two days of treatment every 4 weeks, for 4-6 cycles. There may be a role for a slightly reduced dose at the start of treatment.  -discussed potential side affects of treatment including risk of allergic reactions with antibody -will monitor blood counts with chemotherapy to determine dosing of treatment. There may be a role for transfusion support with treatment or receiving a shot to boost WBCs depending on labs -anticipate that treatment will improve her appetite if her lack of appetite is related to lymphoma -will setup patient for an education session to discuss details of treatment further -patient has no need for port-a-cath placement -red discoloration in feet may be from increased pressure in the veins, and varicose veins may cause it to be more prominent -educated patient on stasis dermatitis -gabapentin may be causing sleepiness. -recommend taking vitamin B complex regularly. Discussed option of liquid version of vitamin B complex if she has swallowing issues with tablets -recommend 2000 units of vitamin D -recommend boost/ensure nutritional supplements to take between meals -premedications may cause drowsiness, and would recommend her not to drive with treatment. Discussed options of using medical transportation or transportation support from family.  Would need to see how patient responds to treatment.  -advised patient to drink at least 2L water day, eat as well as she can, and walk  20-30 minutes daily to reduce potential fatigue from chemotherapy -answered all of patient's and her daughter's questions in detail  FOLLOW-UP: ***  The total time spent in the appointment was *** minutes* .  All of the patient's questions were answered with apparent satisfaction. The patient knows to call the clinic with any problems, questions or concerns.   Wyvonnia Lora MD MS AAHIVMS [facility] Hematology/Oncology Physician [facility]  .*Total Encounter Time as defined by the Centers for Medicare and Medicaid Services includes, in addition to the face-to-face time of a patient visit (documented in the note above) non-face-to-face time: obtaining and reviewing outside history, ordering and reviewing medications, tests or procedures, care coordination (communications with other health care professionals or caregivers) and documentation in the medical record.    I,Mitra Faeizi,acting as a Neurosurgeon for Wyvonnia Lora, MD.,have documented all relevant documentation on the behalf of Wyvonnia Lora, MD,as directed by  Wyvonnia Lora, MD while in the presence of Wyvonnia Lora, MD.  ***

## 2023-08-30 ENCOUNTER — Inpatient Hospital Stay: Payer: PPO | Admitting: Hematology

## 2023-08-30 ENCOUNTER — Inpatient Hospital Stay: Payer: PPO

## 2023-08-30 VITALS — BP 144/80 | HR 73 | Temp 98.1°F | Resp 16

## 2023-08-30 DIAGNOSIS — Z5111 Encounter for antineoplastic chemotherapy: Secondary | ICD-10-CM | POA: Diagnosis not present

## 2023-08-30 DIAGNOSIS — C8310 Mantle cell lymphoma, unspecified site: Secondary | ICD-10-CM

## 2023-08-30 LAB — CBC WITH DIFFERENTIAL (CANCER CENTER ONLY)
Abs Immature Granulocytes: 0.02 10*3/uL (ref 0.00–0.07)
Basophils Absolute: 0 10*3/uL (ref 0.0–0.1)
Basophils Relative: 1 %
Eosinophils Absolute: 0.1 10*3/uL (ref 0.0–0.5)
Eosinophils Relative: 2 %
HCT: 40.1 % (ref 36.0–46.0)
Hemoglobin: 13.3 g/dL (ref 12.0–15.0)
Immature Granulocytes: 1 %
Lymphocytes Relative: 17 %
Lymphs Abs: 0.7 10*3/uL (ref 0.7–4.0)
MCH: 30.6 pg (ref 26.0–34.0)
MCHC: 33.2 g/dL (ref 30.0–36.0)
MCV: 92.2 fL (ref 80.0–100.0)
Monocytes Absolute: 0.3 10*3/uL (ref 0.1–1.0)
Monocytes Relative: 7 %
Neutro Abs: 2.8 10*3/uL (ref 1.7–7.7)
Neutrophils Relative %: 72 %
Platelet Count: 103 10*3/uL — ABNORMAL LOW (ref 150–400)
RBC: 4.35 MIL/uL (ref 3.87–5.11)
RDW: 14.6 % (ref 11.5–15.5)
WBC Count: 3.9 10*3/uL — ABNORMAL LOW (ref 4.0–10.5)
nRBC: 0 % (ref 0.0–0.2)

## 2023-08-30 LAB — CMP (CANCER CENTER ONLY)
ALT: 13 U/L (ref 0–44)
AST: 22 U/L (ref 15–41)
Albumin: 4.2 g/dL (ref 3.5–5.0)
Alkaline Phosphatase: 57 U/L (ref 38–126)
Anion gap: 6 (ref 5–15)
BUN: 7 mg/dL — ABNORMAL LOW (ref 8–23)
CO2: 31 mmol/L (ref 22–32)
Calcium: 9.4 mg/dL (ref 8.9–10.3)
Chloride: 107 mmol/L (ref 98–111)
Creatinine: 0.81 mg/dL (ref 0.44–1.00)
GFR, Estimated: >60 mL/min (ref >60)
Glucose, Bld: 105 mg/dL — ABNORMAL HIGH (ref 70–99)
Potassium: 3.4 mmol/L — ABNORMAL LOW (ref 3.5–5.1)
Sodium: 144 mmol/L (ref 135–145)
Total Bilirubin: 0.6 mg/dL (ref 0.3–1.2)
Total Protein: 7.2 g/dL (ref 6.5–8.1)

## 2023-08-30 LAB — HEPATITIS B CORE ANTIBODY, TOTAL: Hep B Core Total Ab: NONREACTIVE

## 2023-08-30 LAB — HEPATITIS B SURFACE ANTIGEN: Hepatitis B Surface Ag: NONREACTIVE

## 2023-08-30 MED ORDER — SODIUM CHLORIDE 0.9 % IV SOLN
375.0000 mg/m2 | Freq: Once | INTRAVENOUS | Status: AC
  Administered 2023-08-30: 700 mg via INTRAVENOUS
  Filled 2023-08-30: qty 50

## 2023-08-30 MED ORDER — SODIUM CHLORIDE 0.9 % IV SOLN
70.0000 mg/m2 | Freq: Once | INTRAVENOUS | Status: AC
  Administered 2023-08-30: 122.5 mg via INTRAVENOUS
  Filled 2023-08-30: qty 4.9

## 2023-08-30 MED ORDER — SODIUM CHLORIDE 0.9 % IV SOLN
10.0000 mg | Freq: Once | INTRAVENOUS | Status: AC
  Administered 2023-08-30: 10 mg via INTRAVENOUS
  Filled 2023-08-30: qty 10

## 2023-08-30 MED ORDER — DIPHENHYDRAMINE HCL 25 MG PO CAPS
50.0000 mg | ORAL_CAPSULE | Freq: Once | ORAL | Status: AC
  Administered 2023-08-30: 50 mg via ORAL
  Filled 2023-08-30: qty 2

## 2023-08-30 MED ORDER — PALONOSETRON HCL INJECTION 0.25 MG/5ML
0.2500 mg | Freq: Once | INTRAVENOUS | Status: AC
  Administered 2023-08-30: 0.25 mg via INTRAVENOUS
  Filled 2023-08-30: qty 5

## 2023-08-30 MED ORDER — ACETAMINOPHEN 325 MG PO TABS
650.0000 mg | ORAL_TABLET | Freq: Once | ORAL | Status: AC
  Administered 2023-08-30: 650 mg via ORAL
  Filled 2023-08-30: qty 2

## 2023-08-30 NOTE — Patient Instructions (Signed)
[facility]  Discharge Instructions: Thank you for choosing [facility] to provide your oncology and hematology care.   If you have a lab appointment with the Cancer Center, please go directly to the Cancer Center and check in at the registration area.   Wear comfortable clothing and clothing appropriate for easy access to any Portacath or PICC line.   We strive to give you quality time with your provider. You may need to reschedule your appointment if you arrive late (15 or more minutes).  Arriving late affects you and other patients whose appointments are after yours.  Also, if you miss three or more appointments without notifying the office, you may be dismissed from the clinic at the provider's discretion.      For prescription refill requests, have your pharmacy contact our office and allow 72 hours for refills to be completed.    Today you received the following chemotherapy and/or immunotherapy agents : Rituxan, Bendeka      To help prevent nausea and vomiting after your treatment, we encourage you to take your nausea medication as directed.  BELOW ARE SYMPTOMS THAT SHOULD BE REPORTED IMMEDIATELY: *FEVER GREATER THAN 100.4 F (38 C) OR HIGHER *CHILLS OR SWEATING *NAUSEA AND VOMITING THAT IS NOT CONTROLLED WITH YOUR NAUSEA MEDICATION *UNUSUAL SHORTNESS OF BREATH *UNUSUAL BRUISING OR BLEEDING *URINARY PROBLEMS (pain or burning when urinating, or frequent urination) *BOWEL PROBLEMS (unusual diarrhea, constipation, pain near the anus) TENDERNESS IN MOUTH AND THROAT WITH OR WITHOUT PRESENCE OF ULCERS (sore throat, sores in mouth, or a toothache) UNUSUAL RASH, SWELLING OR PAIN  UNUSUAL VAGINAL DISCHARGE OR ITCHING   Items with * indicate a potential emergency and should be followed up as soon as possible or go to the Emergency Department if any problems should occur.  Please show the CHEMOTHERAPY ALERT CARD or IMMUNOTHERAPY ALERT CARD  at check-in to the Emergency Department and triage nurse.  Should you have questions after your visit or need to cancel or reschedule your appointment, please contact [facility]  Dept: [phone]  and follow the prompts.  Office hours are 8:00 a.m. to 4:30 p.m. Monday - Friday. Please note that voicemails left after 4:00 p.m. may not be returned until the following business day.  We are closed weekends and major holidays. You have access to a nurse at all times for urgent questions. Please call the main number to the clinic Dept: [phone] and follow the prompts.   For any non-urgent questions, you may also contact your provider using MyChart. We now offer e-Visits for anyone 39 and older to request care online for non-urgent symptoms. For details visit [email or web address].   Also download the MyChart app! Go to the app store, search "MyChart", open the app, select [place], and log in with your MyChart username and password.

## 2023-08-31 ENCOUNTER — Ambulatory Visit: Payer: PPO | Admitting: Physician Assistant

## 2023-08-31 ENCOUNTER — Other Ambulatory Visit: Payer: Self-pay | Admitting: Hematology

## 2023-08-31 ENCOUNTER — Telehealth: Payer: Self-pay

## 2023-08-31 ENCOUNTER — Inpatient Hospital Stay: Payer: PPO

## 2023-08-31 VITALS — BP 130/61 | HR 72 | Temp 97.7°F | Resp 16

## 2023-08-31 DIAGNOSIS — Z5111 Encounter for antineoplastic chemotherapy: Secondary | ICD-10-CM | POA: Diagnosis not present

## 2023-08-31 DIAGNOSIS — C8310 Mantle cell lymphoma, unspecified site: Secondary | ICD-10-CM

## 2023-08-31 MED ORDER — SODIUM CHLORIDE 0.9 % IV SOLN
70.0000 mg/m2 | Freq: Once | INTRAVENOUS | Status: AC
  Administered 2023-08-31: 122.5 mg via INTRAVENOUS
  Filled 2023-08-31: qty 4.9

## 2023-08-31 MED ORDER — DEXAMETHASONE SODIUM PHOSPHATE 10 MG/ML IJ SOLN
10.0000 mg | Freq: Once | INTRAMUSCULAR | Status: AC
  Administered 2023-08-31: 10 mg via INTRAVENOUS
  Filled 2023-08-31: qty 1

## 2023-08-31 MED ORDER — SODIUM CHLORIDE 0.9 % IV SOLN: INTRAVENOUS | Status: DC

## 2023-08-31 NOTE — Telephone Encounter (Signed)
LM for patient that this nurse was calling to see how they were doing after their treatment. Please call back to Dr. Kale's nurse at [phone] if they have any questions or concerns regarding the treatment. 

## 2023-08-31 NOTE — Patient Instructions (Signed)
[facility]   Discharge Instructions: Thank you for choosing [facility] to provide your oncology and hematology care.   If you have a lab appointment with the Cancer Center, please go directly to the Cancer Center and check in at the registration area.   Wear comfortable clothing and clothing appropriate for easy access to any Portacath or PICC line.   We strive to give you quality time with your provider. You may need to reschedule your appointment if you arrive late (15 or more minutes).  Arriving late affects you and other patients whose appointments are after yours.  Also, if you miss three or more appointments without notifying the office, you may be dismissed from the clinic at the provider's discretion.      For prescription refill requests, have your pharmacy contact our office and allow 72 hours for refills to be completed.    Today you received the following chemotherapy and/or immunotherapy agents: Bendamustine [employer])      To help prevent nausea and vomiting after your treatment, we encourage you to take your nausea medication as directed.  BELOW ARE SYMPTOMS THAT SHOULD BE REPORTED IMMEDIATELY: *FEVER GREATER THAN 100.4 F (38 C) OR HIGHER *CHILLS OR SWEATING *NAUSEA AND VOMITING THAT IS NOT CONTROLLED WITH YOUR NAUSEA MEDICATION *UNUSUAL SHORTNESS OF BREATH *UNUSUAL BRUISING OR BLEEDING *URINARY PROBLEMS (pain or burning when urinating, or frequent urination) *BOWEL PROBLEMS (unusual diarrhea, constipation, pain near the anus) TENDERNESS IN MOUTH AND THROAT WITH OR WITHOUT PRESENCE OF ULCERS (sore throat, sores in mouth, or a toothache) UNUSUAL RASH, SWELLING OR PAIN  UNUSUAL VAGINAL DISCHARGE OR ITCHING   Items with * indicate a potential emergency and should be followed up as soon as possible or go to the Emergency Department if any problems should occur.  Please show the CHEMOTHERAPY ALERT CARD or IMMUNOTHERAPY  ALERT CARD at check-in to the Emergency Department and triage nurse.  Should you have questions after your visit or need to cancel or reschedule your appointment, please contact [place] [facility]  Dept: [phone]  and follow the prompts.  Office hours are 8:00 a.m. to 4:30 p.m. Monday - Friday. Please note that voicemails left after 4:00 p.m. may not be returned until the following business day.  We are closed weekends and major holidays. You have access to a nurse at all times for urgent questions. Please call the main number to the clinic Dept: [phone] and follow the prompts.   For any non-urgent questions, you may also contact your provider using MyChart. We now offer e-Visits for anyone 63 and older to request care online for non-urgent symptoms. For details visit [email or web address].   Also download the MyChart app! Go to the app store, search "MyChart", open the app, select [place], and log in with your MyChart username and password.

## 2023-08-31 NOTE — Telephone Encounter (Signed)
-----   Message from Nurse Lanora Manis A sent at 08/30/2023  4:50 PM EDT ----- Regarding: First time 08/30/23- First Time Rituxience, Bendenka- tolerated well. Dr. Candise Che

## 2023-08-31 NOTE — Progress Notes (Signed)
Maintain dose of Bendeka at 70 mg/m2 for today.  Dose updated to above.  T.O. Dr Pieter Partridge

## 2023-09-12 ENCOUNTER — Other Ambulatory Visit: Payer: Self-pay

## 2023-09-12 DIAGNOSIS — C8310 Mantle cell lymphoma, unspecified site: Secondary | ICD-10-CM

## 2023-09-14 ENCOUNTER — Inpatient Hospital Stay: Payer: PPO

## 2023-09-14 ENCOUNTER — Inpatient Hospital Stay: Payer: PPO | Attending: Hematology | Admitting: Hematology

## 2023-09-14 ENCOUNTER — Other Ambulatory Visit: Payer: PPO

## 2023-09-14 VITALS — BP 149/69 | HR 82 | Temp 98.4°F | Resp 18 | Wt 142.0 lb

## 2023-09-14 DIAGNOSIS — K591 Functional diarrhea: Secondary | ICD-10-CM

## 2023-09-14 DIAGNOSIS — K580 Irritable bowel syndrome with diarrhea: Secondary | ICD-10-CM | POA: Insufficient documentation

## 2023-09-14 DIAGNOSIS — C8310 Mantle cell lymphoma, unspecified site: Secondary | ICD-10-CM

## 2023-09-14 DIAGNOSIS — F418 Other specified anxiety disorders: Secondary | ICD-10-CM | POA: Insufficient documentation

## 2023-09-14 DIAGNOSIS — G609 Hereditary and idiopathic neuropathy, unspecified: Secondary | ICD-10-CM | POA: Diagnosis not present

## 2023-09-14 DIAGNOSIS — Z800 Family history of malignant neoplasm of digestive organs: Secondary | ICD-10-CM | POA: Insufficient documentation

## 2023-09-14 DIAGNOSIS — Z5112 Encounter for antineoplastic immunotherapy: Secondary | ICD-10-CM | POA: Diagnosis not present

## 2023-09-14 DIAGNOSIS — C8313 Mantle cell lymphoma, intra-abdominal lymph nodes: Secondary | ICD-10-CM | POA: Diagnosis not present

## 2023-09-14 DIAGNOSIS — M545 Low back pain, unspecified: Secondary | ICD-10-CM | POA: Insufficient documentation

## 2023-09-14 DIAGNOSIS — Z803 Family history of malignant neoplasm of breast: Secondary | ICD-10-CM | POA: Insufficient documentation

## 2023-09-14 DIAGNOSIS — M7989 Other specified soft tissue disorders: Secondary | ICD-10-CM | POA: Insufficient documentation

## 2023-09-14 DIAGNOSIS — Z5111 Encounter for antineoplastic chemotherapy: Secondary | ICD-10-CM | POA: Diagnosis not present

## 2023-09-14 LAB — CBC WITH DIFFERENTIAL (CANCER CENTER ONLY)
Abs Immature Granulocytes: 0.02 10*3/uL (ref 0.00–0.07)
Basophils Absolute: 0.1 10*3/uL (ref 0.0–0.1)
Basophils Relative: 1 %
Eosinophils Absolute: 0.2 10*3/uL (ref 0.0–0.5)
Eosinophils Relative: 3 %
HCT: 36 % (ref 36.0–46.0)
Hemoglobin: 11.7 g/dL — ABNORMAL LOW (ref 12.0–15.0)
Immature Granulocytes: 0 %
Lymphocytes Relative: 4 %
Lymphs Abs: 0.3 10*3/uL — ABNORMAL LOW (ref 0.7–4.0)
MCH: 29.8 pg (ref 26.0–34.0)
MCHC: 32.5 g/dL (ref 30.0–36.0)
MCV: 91.8 fL (ref 80.0–100.0)
Monocytes Absolute: 0.5 10*3/uL (ref 0.1–1.0)
Monocytes Relative: 9 %
Neutro Abs: 5 10*3/uL (ref 1.7–7.7)
Neutrophils Relative %: 83 %
Platelet Count: 153 10*3/uL (ref 150–400)
RBC: 3.92 MIL/uL (ref 3.87–5.11)
RDW: 14.8 % (ref 11.5–15.5)
WBC Count: 6 10*3/uL (ref 4.0–10.5)
nRBC: 0 % (ref 0.0–0.2)

## 2023-09-14 LAB — CMP (CANCER CENTER ONLY)
ALT: 22 U/L (ref 0–44)
AST: 27 U/L (ref 15–41)
Albumin: 3.6 g/dL (ref 3.5–5.0)
Alkaline Phosphatase: 52 U/L (ref 38–126)
Anion gap: 4 — ABNORMAL LOW (ref 5–15)
BUN: 8 mg/dL (ref 8–23)
CO2: 31 mmol/L (ref 22–32)
Calcium: 8.8 mg/dL — ABNORMAL LOW (ref 8.9–10.3)
Chloride: 103 mmol/L (ref 98–111)
Creatinine: 0.75 mg/dL (ref 0.44–1.00)
GFR, Estimated: >60 mL/min (ref >60)
Glucose, Bld: 188 mg/dL — ABNORMAL HIGH (ref 70–99)
Potassium: 3.7 mmol/L (ref 3.5–5.1)
Sodium: 138 mmol/L (ref 135–145)
Total Bilirubin: 0.7 mg/dL (ref 0.3–1.2)
Total Protein: 6.5 g/dL (ref 6.5–8.1)

## 2023-09-14 NOTE — Progress Notes (Signed)
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   HISTORY OF PRESENTING ILLNESS:   Courtney House is a wonderful 77 y.o. female who has been referred to [place] by Kathi Der, MD for evaluation and management of Mantle cell lymphoma.   Patient reports having a history of IBS for several years with no recent symptoms. Her bowel symptoms, including diarrhea and abdominal cramping, have been stable and managed with Hyoscyamine.    She reports stable lack of appetite over the last few years. She notes that her weight fluctuates but denies any significant weight loss.   She denies any history of polyps in the past prior to her recent routine colonoscopy with Dr. Levora Angel which showed multiple polyps which were noted to be tubular adenomas, however the mass in sigmoid colon which was reported to look like a "lipoma" on colonoscopy on biopsy turned out to show a mantle cell lymphoma.  Her previous colonoscopy was likely about 5-6 years ago, though she admits her timeline may be inaccurate.  Patient has stable bilateral leg swelling and occasional lower back pain. She denies any sudden weight loss, fever, chills, night sweats, new lumps/bumps, or swallowing issues.   She reports occasional episodes of feeling flushed with itchiness and redness in her face/head. These episodes have been occurring since a year ago. She does not tend to get acid reflux.  Patient denies any heart, lung, liver, or kidney issues. Patient is able to stay active with no significant physical limitations. She does note having neuropathy in her bilateral lower extremities, which she controls with Tramadol and Gabapentin, managed by neurology.  Patient is a never smoker and denies any alcohol use.  In regards to her fhx, she  reports that her sister has breast and colon cancer. Also, her mother has a history of brain tumor, her father has CHF, her brother has throat cancer, and her maternal uncle was diagnosed with mouth cancer and was a frequent smoker. She notes that her brother was a smoker, but likely did not have heavy use.   INTERVAL HISTORY:  Courtney House is a 77 y.o. female here for continued evaluation and management of mantle cell lymphoma. She was seen by me on 08/17/2023 and reported skin rashes with occasional itchiness, right foot swelling, head itchiness, fatigue, frequent bowel habits, issues swallowing tablets sometimes, worsened appetite, abdominal pain managed by Hyoscyamine, and neuropathy managed by Gabapentin.  Today, she returns for toxicity check after her first cycle of treatment. Patient reports having a syncope episode on Tuesday, 09/11/2023 in her room. She reports scraping her left elbow, reports a bruise on her left side, and notes some mild soreness in another area. Patient denies any significant injuries from her syncope. She reports that she was unconscious for less than a minute. Patient denies any dizziness or lightheadedness since her syncope episode.   She denies any uncontrolled nausea or vomiting, skin rashes, mouth sores, or leg swelling.  She does regularly take walks around her community when taking out her trash. 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   She complains of some fatigue which limits her daily activities. Patient reports that it may have mildly improved recently.  She is unsure if she has received the RSV vaccine. Patinet plans of getting her age-appropriate vaccines next  week.   She reports endorsing acid reflux symptoms last week. Patient tried taking an antinausea medication, which she believes was Compazine, and she has not had any acid reflux symptoms since. Compazine did not casue her to be increasingly sleepy.   She complains of  GI issues involving diarrhea. She reports having stable diarrhea from IBS 3 times in a day. Patient took diarrhea medication, which improved symptoms. She typically does not need to use diarrhea medication often. Patient reports having a sudden urge to use the restroom after lying down, and continued to feel poorly after having a bowel movement. She denies any abdominal pain at this time.   Patient reports having low appetite, eating 1/3 her usual. She reports that she generally forces herself to eat and reports lack of taste. She generally eats 2 meals a day and is working towards consuming crackers between meals. She is planning on trying out [employer] protein drinks. She reports that she generally drinks plenty of water and she also drinks orange juice and tea sometimes.  MEDICAL HISTORY:  Past Medical History:  Diagnosis Date   Anxiety    Carpal tunnel syndrome    Depression    Hypercholesterolemia    IBS (irritable bowel syndrome)    Insomnia    Sensory disease or syndrome 05/05/2014    SURGICAL HISTORY: Past Surgical History:  Procedure Laterality Date   CARPAL TUNNEL RELEASE Right    TUBAL LIGATION  1974    SOCIAL HISTORY: Social History   Socioeconomic History   Marital status: Widowed    Spouse name: Not on file   Number of children: 2   Years of education: 54   Highest education level: Not on file  Occupational History   Occupation: [employer]  Tobacco Use   Smoking status: Never   Smokeless tobacco: Never  Substance and Sexual Activity   Alcohol use: No    Alcohol/week: 0.0 standard drinks of alcohol   Drug use: No   Sexual activity: Not on file  Other Topics Concern   Not on file  Social History Narrative   Patient is widowed and her daughter and grandson lives with her.   Patient does drink one glass of tea daily.   Patient works on an  Theatre stage manager.   Patient has a high school education.   Patient has two adult children.   Patient is right-handed.                   Social Determinants of Health   Financial Resource Strain: Not on file  Food Insecurity: Not on file  Transportation Needs: Not on file  Physical Activity: Not on file  Stress: Not on file  Social Connections: Not on file  Intimate Partner Violence: Not on file    FAMILY HISTORY: Family History  Problem Relation Age of Onset   Breast cancer Sister        lymph nodes   Parkinson's disease Brother    Throat cancer Brother    Macular degeneration Brother     ALLERGIES:  has No Known Allergies.  MEDICATIONS:  Current Outpatient Medications  Medication Sig Dispense Refill   acyclovir (ZOVIRAX) 400 MG tablet Take 1 tablet (400 mg total) by mouth daily. 30 tablet 3   allopurinol (ZYLOPRIM) 300 MG tablet Take 0.5 tablets (150 mg total) by mouth daily. 30 tablet 0   calcium gluconate 650 MG tablet Take 650 mg by mouth daily.     cholecalciferol (VITAMIN D) 1000 UNITS  tablet Take 1,000 Units by mouth daily.     dexamethasone (DECADRON) 4 MG tablet Take 2 tablets (8 mg total) by mouth daily. Start the day after bendamustine chemotherapy for 2 days. Take with food. 30 tablet 1   gabapentin (NEURONTIN) 400 MG capsule TAKE ONE CAPSULE BY MOUTH IN THE MORNING AND TAKE 1 CAPSULE IN EVENING AND TAKE 2 CAPSULES AT BEDTIME 120 capsule 0   hyoscyamine (LEVSIN, ANASPAZ) 0.125 MG tablet Take 0.125 mg by mouth every 4 (four) hours as needed (abdominal pain).      ondansetron (ZOFRAN) 8 MG tablet Take 1 tablet (8 mg total) by mouth every 8 (eight) hours as needed for nausea or vomiting. Start on the third day after chemotherapy. 30 tablet 1   Prenatal Vit-Fe Fumarate-FA (PRENATAL VITAMINS) 28-0.8 MG TABS Take 28 mg by mouth 1 day or 1 dose. (Patient taking differently: Take 28 mg by mouth daily.) 30 tablet 5   prochlorperazine (COMPAZINE) 10 MG tablet Take 1 tablet (10 mg total) by mouth every 6 (six) hours as needed for nausea or vomiting. 30 tablet 1   temazepam (RESTORIL) 15 MG capsule  Take 15 mg by mouth at bedtime as needed for sleep.     traMADol (ULTRAM) 50 MG tablet Take 50 mg by mouth 2 (two) times daily. 50mg  in am and 50mg  in pm     No current facility-administered medications for this visit.    REVIEW OF SYSTEMS:    10 Point review of Systems was done is negative except as noted above.   PHYSICAL EXAMINATION: ECOG PERFORMANCE STATUS: 1 - Symptomatic but completely ambulatory  . There were no vitals filed for this visit.  There were no vitals filed for this visit.  .There is no height or weight on file to calculate BMI.   GENERAL:alert, in no acute distress and comfortable SKIN: no acute rashes, no significant lesions EYES: conjunctiva are pink and non-injected, sclera anicteric OROPHARYNX: MMM, no exudates, no oropharyngeal erythema or ulceration NECK: supple, no JVD LYMPH:  no palpable lymphadenopathy in the cervical, axillary or inguinal regions LUNGS: clear to auscultation b/l with normal respiratory effort HEART: regular rate & rhythm ABDOMEN:  normoactive bowel sounds , non tender, not distended. Extremity: no pedal edema PSYCH: alert & oriented x 3 with fluent speech NEURO: no focal motor/sensory deficits    LABORATORY DATA:  I have reviewed the data as listed  .    Latest Ref Rng & Units 08/30/2023    9:47 AM 07/13/2023   12:37 PM 01/05/2020   12:50 PM  CBC  WBC 4.0 - 10.5 K/uL 3.9  4.0  10.4   Hemoglobin 12.0 - 15.0 g/dL 96.0  45.4  09.8   Hematocrit 36.0 - 46.0 % 40.1  39.9  39.2   Platelets 150 - 400 K/uL 103  96  141     .    Latest Ref Rng & Units 08/30/2023    9:47 AM 07/13/2023   12:37 PM 01/05/2020   12:50 PM  CMP  Glucose 70 - 99 mg/dL 119  79  147   BUN 8 - 23 mg/dL 7  10  12    Creatinine 0.44 - 1.00 mg/dL 8.29  5.62  1.30   Sodium 135 - 145 mmol/L 144  142  140   Potassium 3.5 - 5.1 mmol/L 3.4  4.1  3.8   Chloride 98 - 111 mmol/L 107  106  105   CO2 22 - 32 mmol/L 31  30  28   Calcium 8.9 - 10.3 mg/dL 9.4  8.9   8.5   Total Protein 6.5 - 8.1 g/dL 7.2  7.0  6.8   Total Bilirubin 0.3 - 1.2 mg/dL 0.6  0.5  0.6   Alkaline Phos 38 - 126 U/L 57  55  53   AST 15 - 41 U/L 22  20  22    ALT 0 - 44 U/L 13  13  15       RADIOGRAPHIC STUDIES: I have personally reviewed the radiological images as listed and agreed with the findings in the report. No results found.  ASSESSMENT & PLAN:   77 y.o. female with:  Mantle cell lymphoma- newly diagnosed. Found as mass in the sigmoid colon on colonoscopy done to evaluate for Fhx of colon cancer and chronic IBS like symptoms. IBS Fhx of breast and colon cancer.  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 CBC stable, showed WBC of 6.0K, hemoglobin of 11.7, and platelets improved 153K. -previous low platelets were related to mantle cell and have normalized -CMP normal -diarrhea is likely related to baseline IBS. Bendamustine does not particulary cause diarrhea  -discussed that her lesion in the sigmoid colon may cause inflammation and diarrhea -okay to use imodium if needed for diarrhea -recommend optimizing her nutritional status especially to prevent syncope episodes given her hx of diarrhea from IBS. Advised patient to eat as much as possible as often as she can. Recommend smaller and more frequent meals.Discussed options of nutritional supplements such as Boost/Ensure.  -tylenol would be preferred over Ibuprofen for body aches.  -would recommend patient to stay UTD with age-appropriate vaccines including flu shot and RSV vaccine.  -answered all of patient's questions in detail -will continue to monitor with labs in 2 weeks  FOLLOW-UP: Follow-up for cycle 2 of Bendamustine Rituxan as per scheduled appointments. Please schedule cycle 3 of Bendamustine Rituxan as per orders with labs and MD visit with cycle 3-day 1.  The total time spent in the appointment  was *** minutes* .  All of the patient's questions were answered with apparent satisfaction. The patient knows to call the clinic with any problems, questions or concerns.   Wyvonnia Lora MD MS AAHIVMS [facility] Hematology/Oncology Physician [facility]  .*Total Encounter Time as defined by the Centers for Medicare and Medicaid Services includes, in addition to the face-to-face time of a patient visit (documented in the note above) non-face-to-face time: obtaining and reviewing outside history, ordering and reviewing medications, tests or procedures, care coordination (communications with other health care professionals or caregivers) and documentation in the medical record.    I,Mitra Faeizi,acting as a Neurosurgeon for Wyvonnia Lora, MD.,have documented all relevant documentation on the behalf of Wyvonnia Lora, MD,as directed by  Wyvonnia Lora, MD while in the presence of Wyvonnia Lora, MD.  ***

## 2023-09-18 ENCOUNTER — Other Ambulatory Visit: Payer: Self-pay

## 2023-09-19 ENCOUNTER — Encounter: Payer: Self-pay | Admitting: Hematology

## 2023-09-27 ENCOUNTER — Inpatient Hospital Stay: Payer: PPO

## 2023-09-27 VITALS — BP 140/65 | HR 64 | Temp 98.2°F | Resp 16

## 2023-09-27 DIAGNOSIS — C8310 Mantle cell lymphoma, unspecified site: Secondary | ICD-10-CM

## 2023-09-27 DIAGNOSIS — Z5111 Encounter for antineoplastic chemotherapy: Secondary | ICD-10-CM | POA: Diagnosis not present

## 2023-09-27 LAB — CBC WITH DIFFERENTIAL (CANCER CENTER ONLY)
Abs Immature Granulocytes: 0.01 10*3/uL (ref 0.00–0.07)
Basophils Absolute: 0.1 10*3/uL (ref 0.0–0.1)
Basophils Relative: 2 %
Eosinophils Absolute: 0.1 10*3/uL (ref 0.0–0.5)
Eosinophils Relative: 3 %
HCT: 38.2 % (ref 36.0–46.0)
Hemoglobin: 12.2 g/dL (ref 12.0–15.0)
Immature Granulocytes: 0 %
Lymphocytes Relative: 7 %
Lymphs Abs: 0.4 10*3/uL — ABNORMAL LOW (ref 0.7–4.0)
MCH: 29.8 pg (ref 26.0–34.0)
MCHC: 31.9 g/dL (ref 30.0–36.0)
MCV: 93.2 fL (ref 80.0–100.0)
Monocytes Absolute: 0.5 10*3/uL (ref 0.1–1.0)
Monocytes Relative: 10 %
Neutro Abs: 3.8 10*3/uL (ref 1.7–7.7)
Neutrophils Relative %: 78 %
Platelet Count: 146 10*3/uL — ABNORMAL LOW (ref 150–400)
RBC: 4.1 MIL/uL (ref 3.87–5.11)
RDW: 15 % (ref 11.5–15.5)
WBC Count: 4.9 10*3/uL (ref 4.0–10.5)
nRBC: 0 % (ref 0.0–0.2)

## 2023-09-27 LAB — CMP (CANCER CENTER ONLY)
ALT: 10 U/L (ref 0–44)
AST: 16 U/L (ref 15–41)
Albumin: 3.8 g/dL (ref 3.5–5.0)
Alkaline Phosphatase: 62 U/L (ref 38–126)
Anion gap: 5 (ref 5–15)
BUN: 8 mg/dL (ref 8–23)
CO2: 34 mmol/L — ABNORMAL HIGH (ref 22–32)
Calcium: 9.2 mg/dL (ref 8.9–10.3)
Chloride: 106 mmol/L (ref 98–111)
Creatinine: 0.77 mg/dL (ref 0.44–1.00)
GFR, Estimated: >60 mL/min (ref >60)
Glucose, Bld: 121 mg/dL — ABNORMAL HIGH (ref 70–99)
Potassium: 3.9 mmol/L (ref 3.5–5.1)
Sodium: 145 mmol/L (ref 135–145)
Total Bilirubin: 0.3 mg/dL (ref <1.2)
Total Protein: 6.6 g/dL (ref 6.5–8.1)

## 2023-09-27 MED ORDER — SODIUM CHLORIDE 0.9 % IV SOLN
70.0000 mg/m2 | Freq: Once | INTRAVENOUS | Status: AC
  Administered 2023-09-27: 122.5 mg via INTRAVENOUS
  Filled 2023-09-27: qty 4.9

## 2023-09-27 MED ORDER — DEXAMETHASONE SODIUM PHOSPHATE 10 MG/ML IJ SOLN
10.0000 mg | Freq: Once | INTRAMUSCULAR | Status: AC
  Administered 2023-09-27: 10 mg via INTRAVENOUS
  Filled 2023-09-27: qty 1

## 2023-09-27 MED ORDER — DIPHENHYDRAMINE HCL 25 MG PO CAPS
50.0000 mg | ORAL_CAPSULE | Freq: Once | ORAL | Status: AC
  Administered 2023-09-27: 50 mg via ORAL
  Filled 2023-09-27: qty 2

## 2023-09-27 MED ORDER — SODIUM CHLORIDE 0.9 % IV SOLN
375.0000 mg/m2 | Freq: Once | INTRAVENOUS | Status: DC
Start: 2023-09-27 — End: 2023-09-27

## 2023-09-27 MED ORDER — SODIUM CHLORIDE 0.9 % IV SOLN
375.0000 mg/m2 | Freq: Once | INTRAVENOUS | Status: AC
  Administered 2023-09-27: 700 mg via INTRAVENOUS
  Filled 2023-09-27: qty 50

## 2023-09-27 MED ORDER — PALONOSETRON HCL INJECTION 0.25 MG/5ML
0.2500 mg | Freq: Once | INTRAVENOUS | Status: AC
  Administered 2023-09-27: 0.25 mg via INTRAVENOUS
  Filled 2023-09-27: qty 5

## 2023-09-27 MED ORDER — SODIUM CHLORIDE 0.9 % IV SOLN: INTRAVENOUS | Status: DC

## 2023-09-27 MED ORDER — ACETAMINOPHEN 325 MG PO TABS
650.0000 mg | ORAL_TABLET | Freq: Once | ORAL | Status: AC
  Administered 2023-09-27: 650 mg via ORAL
  Filled 2023-09-27: qty 2

## 2023-09-27 NOTE — Patient Instructions (Signed)
[facility] - A DEPT OF [facility]  Discharge Instructions: Thank you for choosing [facility] to provide your oncology and hematology care.   If you have a lab appointment with the Cancer Center, please go directly to the Cancer Center and check in at the registration area.   Wear comfortable clothing and clothing appropriate for easy access to any Portacath or PICC line.   We strive to give you quality time with your provider. You may need to reschedule your appointment if you arrive late (15 or more minutes).  Arriving late affects you and other patients whose appointments are after yours.  Also, if you miss three or more appointments without notifying the office, you may be dismissed from the clinic at the provider's discretion.      For prescription refill requests, have your pharmacy contact our office and allow 72 hours for refills to be completed.    Today you received the following chemotherapy and/or immunotherapy agents: rituximab and bendamustine      To help prevent nausea and vomiting after your treatment, we encourage you to take your nausea medication as directed.  BELOW ARE SYMPTOMS THAT SHOULD BE REPORTED IMMEDIATELY: *FEVER GREATER THAN 100.4 F (38 C) OR HIGHER *CHILLS OR SWEATING *NAUSEA AND VOMITING THAT IS NOT CONTROLLED WITH YOUR NAUSEA MEDICATION *UNUSUAL SHORTNESS OF BREATH *UNUSUAL BRUISING OR BLEEDING *URINARY PROBLEMS (pain or burning when urinating, or frequent urination) *BOWEL PROBLEMS (unusual diarrhea, constipation, pain near the anus) TENDERNESS IN MOUTH AND THROAT WITH OR WITHOUT PRESENCE OF ULCERS (sore throat, sores in mouth, or a toothache) UNUSUAL RASH, SWELLING OR PAIN  UNUSUAL VAGINAL DISCHARGE OR ITCHING   Items with * indicate a potential emergency and should be followed up as soon as possible or go to the Emergency Department if any problems should occur.  Please show the CHEMOTHERAPY ALERT CARD  or IMMUNOTHERAPY ALERT CARD at check-in to the Emergency Department and triage nurse.  Should you have questions after your visit or need to cancel or reschedule your appointment, please contact [facility] - A DEPT OF [facility]  Dept: [phone]  and follow the prompts.  Office hours are 8:00 a.m. to 4:30 p.m. Monday - Friday. Please note that voicemails left after 4:00 p.m. may not be returned until the following business day.  We are closed weekends and major holidays. You have access to a nurse at all times for urgent questions. Please call the main number to the clinic Dept: [phone] and follow the prompts.   For any non-urgent questions, you may also contact your provider using MyChart. We now offer e-Visits for anyone 40 and older to request care online for non-urgent symptoms. For details visit [email or web address].   Also download the MyChart app! Go to the app store, search "MyChart", open the app, select [facility], and log in with your MyChart username and password.

## 2023-09-27 NOTE — Progress Notes (Signed)
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   HISTORY OF PRESENTING ILLNESS:   Courtney House is a wonderful 77 y.o. female who has been referred to [place] by Kathi Der, MD for evaluation and management of Mantle cell lymphoma.   Patient reports having a history of IBS for several years with no recent symptoms. Her bowel symptoms, including diarrhea and abdominal cramping, have been stable and managed with Hyoscyamine.    She reports stable lack of appetite over the last few years. She notes that her weight fluctuates but denies any significant weight loss.   She denies any history of polyps in the past prior to her recent routine colonoscopy with Dr. Levora Angel which showed multiple polyps which were noted to be tubular adenomas, however the mass in sigmoid colon which was reported to look like a "lipoma" on colonoscopy on biopsy turned out to show a mantle cell lymphoma.  Her previous colonoscopy was likely about 5-6 years ago, though she admits her timeline may be inaccurate.  Patient has stable bilateral leg swelling and occasional lower back pain. She denies any sudden weight loss, fever, chills, night sweats, new lumps/bumps, or swallowing issues.   She reports occasional episodes of feeling flushed with itchiness and redness in her face/head. These episodes have been occurring since a year ago. She does not tend to get acid reflux.  Patient denies any heart, lung, liver, or kidney issues. Patient is able to stay active with no significant physical limitations. She does note having neuropathy in her bilateral lower extremities, which she controls with Tramadol and Gabapentin, managed by neurology.  Patient is a never smoker and denies any alcohol use.  In regards to her fhx,  she reports that her sister has breast and colon cancer. Also, her mother has a history of brain tumor, her father has CHF, her brother has throat cancer, and her maternal uncle was diagnosed with mouth cancer and was a frequent smoker. She notes that her brother was a smoker, but likely did not have heavy use.   INTERVAL HISTORY:  Courtney House is a 77 y.o. female here for continued evaluation and management of mantle cell lymphoma. She was last seen by me on 09/14/2023 and reported a syncope episode, fatigue, acid reflux, diarrhea, low appetite, and lack of taste.  Today, she returns for toxicity check prior to cycle 2 day 2 of treatment. Patient complains of fatigue from treatment yesterday.   She reports that she has not needed to use anti-nausea medication.   She reports that she began endorsing leg swelling after her last visit, which does generally improve over night. She reports that her right leg swelling is typically worse than the left. Her leg swelling is mild in clinic today. Patient complains of ankle pain which she believes may be due to neuropathy. She reports "puffiness" in her feet.   Has been consuming three meals a day and stays well-hydrated with water intake. She reports that she does drink tea frequently. Patient reports that she will try to drink Fairlife protein drinks.   She reports issues taking her vitamins due to forgetting.   She reports lack of energy recently. Her energy levels were normal last week. She reports that she is trying to increase her walking activity.   Patient denies any new lumps/bumps  or issues with her port.   She reports that she has not had any diarrhea recently. Patient notes that she has not had a bowel movement this morning which is unusual. She previously took took a stool softener after not having a bowel movement for four days.   MEDICAL HISTORY:  Past Medical History:  Diagnosis Date   Anxiety    Carpal tunnel syndrome    Depression     Hypercholesterolemia    IBS (irritable bowel syndrome)    Insomnia    Sensory disease or syndrome 05/05/2014    SURGICAL HISTORY: Past Surgical History:  Procedure Laterality Date   CARPAL TUNNEL RELEASE Right    TUBAL LIGATION  1974    SOCIAL HISTORY: Social History   Socioeconomic History   Marital status: Widowed    Spouse name: Not on file   Number of children: 2   Years of education: 3   Highest education level: Not on file  Occupational History   Occupation: [employer]  Tobacco Use   Smoking status: Never   Smokeless tobacco: Never  Substance and Sexual Activity   Alcohol use: No    Alcohol/week: 0.0 standard drinks of alcohol   Drug use: No   Sexual activity: Not on file  Other Topics Concern   Not on file  Social History Narrative   Patient is widowed and her daughter and grandson lives with her.   Patient does drink one glass of tea daily.   Patient works on an  Theatre stage manager.   Patient has a high school education.   Patient has two adult children.   Patient is right-handed.                  Social Determinants of Health   Financial Resource Strain: Not on file  Food Insecurity: Not on file  Transportation Needs: Not on file  Physical Activity: Not on file  Stress: Not on file  Social Connections: Not on file  Intimate Partner Violence: Not on file    FAMILY HISTORY: Family History  Problem Relation Age of Onset   Breast cancer Sister        lymph nodes   Parkinson's disease Brother    Throat cancer Brother    Macular degeneration Brother     ALLERGIES:  has No Known Allergies.  MEDICATIONS:  Current Outpatient Medications  Medication Sig Dispense Refill   acyclovir (ZOVIRAX) 400 MG tablet Take 1 tablet (400 mg total) by mouth daily. 30 tablet 3   allopurinol (ZYLOPRIM) 300 MG tablet Take 0.5 tablets (150 mg total) by mouth daily. 30 tablet 0   calcium gluconate 650 MG tablet Take 650 mg by mouth daily.     cholecalciferol (VITAMIN  D) 1000 UNITS tablet Take 1,000 Units by mouth daily.     dexamethasone (DECADRON) 4 MG tablet Take 2 tablets (8 mg total) by mouth daily. Start the day after bendamustine chemotherapy for 2 days. Take with food. 30 tablet 1   gabapentin (NEURONTIN) 400 MG capsule TAKE ONE CAPSULE BY MOUTH IN THE MORNING AND TAKE 1 CAPSULE IN EVENING AND TAKE 2 CAPSULES AT BEDTIME 120 capsule 0   hyoscyamine (LEVSIN, ANASPAZ) 0.125 MG tablet Take 0.125 mg by mouth every 4 (four) hours as needed (abdominal pain).      ondansetron (ZOFRAN) 8 MG tablet Take 1 tablet (8 mg total) by mouth every 8 (eight) hours as needed for nausea or vomiting. Start on the third day after chemotherapy. 30 tablet  1   Prenatal Vit-Fe Fumarate-FA (PRENATAL VITAMINS) 28-0.8 MG TABS Take 28 mg by mouth 1 day or 1 dose. (Patient taking differently: Take 28 mg by mouth daily.) 30 tablet 5   prochlorperazine (COMPAZINE) 10 MG tablet Take 1 tablet (10 mg total) by mouth every 6 (six) hours as needed for nausea or vomiting. 30 tablet 1   temazepam (RESTORIL) 15 MG capsule Take 15 mg by mouth at bedtime as needed for sleep.     traMADol (ULTRAM) 50 MG tablet Take 50 mg by mouth 2 (two) times daily. 50mg  in am and 50mg  in pm     No current facility-administered medications for this visit.   Facility-Administered Medications Ordered in Other Visits  Medication Dose Route Frequency Provider Last Rate Last Admin   0.9 %  sodium chloride infusion   Intravenous Continuous Johney Maine, MD   Stopped at 09/27/23 1628    REVIEW OF SYSTEMS:    10 Point review of Systems was done is negative except as noted above.   PHYSICAL EXAMINATION: ECOG PERFORMANCE STATUS: 1 - Symptomatic but completely ambulatory  . Vitals:   09/28/23 1318  BP: (!) 146/54  Pulse: 93  Resp: 16  Temp: 97.7 F (36.5 C)  SpO2: 97%     Filed Weights   09/28/23 1318  Weight: 144 lb 1.6 oz (65.4 kg)   .Body mass index is 23.26 kg/m.  GENERAL:alert, in no  acute distress and comfortable SKIN: no acute rashes, no significant lesions EYES: conjunctiva are pink and non-injected, sclera anicteric OROPHARYNX: MMM, no exudates, no oropharyngeal erythema or ulceration NECK: supple, no JVD LYMPH:  no palpable lymphadenopathy in the cervical, axillary or inguinal regions LUNGS: clear to auscultation b/l with normal respiratory effort HEART: regular rate & rhythm ABDOMEN:  normoactive bowel sounds , non tender, not distended. Extremity: no pedal edema PSYCH: alert & oriented x 3 with fluent speech NEURO: no focal motor/sensory deficits    LABORATORY DATA:  I have reviewed the data as listed  .    Latest Ref Rng & Units 09/27/2023   12:41 PM 09/14/2023    9:51 AM 08/30/2023    9:47 AM  CBC  WBC 4.0 - 10.5 K/uL 4.9  6.0  3.9   Hemoglobin 12.0 - 15.0 g/dL 21.3  08.6  57.8   Hematocrit 36.0 - 46.0 % 38.2  36.0  40.1   Platelets 150 - 400 K/uL 146  153  103     .    Latest Ref Rng & Units 09/27/2023   12:41 PM 09/14/2023    9:51 AM 08/30/2023    9:47 AM  CMP  Glucose 70 - 99 mg/dL 469  629  528   BUN 8 - 23 mg/dL 8  8  7    Creatinine 0.44 - 1.00 mg/dL 4.13  2.44  0.10   Sodium 135 - 145 mmol/L 145  138  144   Potassium 3.5 - 5.1 mmol/L 3.9  3.7  3.4   Chloride 98 - 111 mmol/L 106  103  107   CO2 22 - 32 mmol/L 34  31  31   Calcium 8.9 - 10.3 mg/dL 9.2  8.8  9.4   Total Protein 6.5 - 8.1 g/dL 6.6  6.5  7.2   Total Bilirubin <1.2 mg/dL 0.3  0.7  0.6   Alkaline Phos 38 - 126 U/L 62  52  57   AST 15 - 41 U/L 16  27  22  ALT 0 - 44 U/L 10  22  13       RADIOGRAPHIC STUDIES: I have personally reviewed the radiological images as listed and agreed with the findings in the report. No results found.  ASSESSMENT & PLAN:   77 y.o. female with:  Mantle cell lymphoma- newly diagnosed. Found as mass in the sigmoid colon on colonoscopy done to evaluate for Fhx of colon cancer and chronic IBS like symptoms. IBS Fhx of breast and colon  cancer.  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 CBC stable, showed WBC of 4.9K, hemoglobin of 12.2, and platelets of 146K. -CMP stable -patient reports mild fatigue, but denies any major toxicity from treatment -proceed with cycle 2 day 2 of treatment-- continuing BR with same supportive medications. -no role to refill allopurinol once she finishes it as allopurinol would only be needed with first two cycles of treatment -recommend patient to keep her legs elevated to improve swelling -continue to stay well hydrated -recommend taking vitamins with food to help develop routine -will plan for next visit in 2 weeks -answered all of patient's questions in detail  FOLLOW-UP: Labs and MD visit in 2 weeks Please schedule cycle 3 of Bendamustine Rituxan as per orders with labs and MD visit with cycle 3-day 1  The total time spent in the appointment was 30 minutes* .  All of the patient's questions were answered with apparent satisfaction. The patient knows to call the clinic with any problems, questions or concerns.   Wyvonnia Lora MD MS AAHIVMS [facility] Hematology/Oncology Physician [facility]  .*Total Encounter Time as defined by the Centers for Medicare and Medicaid Services includes, in addition to the face-to-face time of a patient visit (documented in the note above) non-face-to-face time: obtaining and reviewing outside history, ordering and reviewing medications, tests or procedures, care coordination (communications with other health care professionals or caregivers) and documentation in the medical record.    I,Mitra Faeizi,acting as a Neurosurgeon for Wyvonnia Lora, MD.,have documented all relevant documentation on the behalf of Wyvonnia Lora, MD,as directed by  Wyvonnia Lora, MD while in the presence of Wyvonnia Lora, MD.  .I have reviewed the above documentation for  accuracy and completeness, and I agree with the above. Johney Maine MD

## 2023-09-28 ENCOUNTER — Inpatient Hospital Stay: Payer: PPO | Admitting: Hematology

## 2023-09-28 ENCOUNTER — Inpatient Hospital Stay: Payer: PPO

## 2023-09-28 VITALS — BP 146/54 | HR 93 | Temp 97.7°F | Resp 16 | Wt 144.1 lb

## 2023-09-28 DIAGNOSIS — Z5111 Encounter for antineoplastic chemotherapy: Secondary | ICD-10-CM | POA: Diagnosis not present

## 2023-09-28 DIAGNOSIS — C8310 Mantle cell lymphoma, unspecified site: Secondary | ICD-10-CM | POA: Diagnosis not present

## 2023-09-28 MED ORDER — SODIUM CHLORIDE 0.9 % IV SOLN
70.0000 mg/m2 | Freq: Once | INTRAVENOUS | Status: AC
  Administered 2023-09-28: 122.5 mg via INTRAVENOUS
  Filled 2023-09-28: qty 4.9

## 2023-09-28 MED ORDER — SODIUM CHLORIDE 0.9 % IV SOLN: INTRAVENOUS | Status: DC

## 2023-09-28 MED ORDER — DEXAMETHASONE SODIUM PHOSPHATE 10 MG/ML IJ SOLN
10.0000 mg | Freq: Once | INTRAMUSCULAR | Status: AC
  Administered 2023-09-28: 10 mg via INTRAVENOUS
  Filled 2023-09-28: qty 1

## 2023-09-28 NOTE — Progress Notes (Signed)
Patient seen by Dr. Addison Naegeli are within treatment parameters.  Labs reviewed: and are within treatment parameters. Labs done 09/27/23  Per physician team, patient is ready for treatment and there are NO modifications to the treatment plan.

## 2023-09-28 NOTE — Patient Instructions (Signed)
[facility] - A DEPT OF [facility]  Discharge Instructions: Thank you for choosing [facility] to provide your oncology and hematology care.   If you have a lab appointment with the Cancer Center, please go directly to the Cancer Center and check in at the registration area.   Wear comfortable clothing and clothing appropriate for easy access to any Portacath or PICC line.   We strive to give you quality time with your provider. You may need to reschedule your appointment if you arrive late (15 or more minutes).  Arriving late affects you and other patients whose appointments are after yours.  Also, if you miss three or more appointments without notifying the office, you may be dismissed from the clinic at the provider's discretion.      For prescription refill requests, have your pharmacy contact our office and allow 72 hours for refills to be completed.    Today you received the following chemotherapy and/or immunotherapy agents Bendeka.      To help prevent nausea and vomiting after your treatment, we encourage you to take your nausea medication as directed.  BELOW ARE SYMPTOMS THAT SHOULD BE REPORTED IMMEDIATELY: *FEVER GREATER THAN 100.4 F (38 C) OR HIGHER *CHILLS OR SWEATING *NAUSEA AND VOMITING THAT IS NOT CONTROLLED WITH YOUR NAUSEA MEDICATION *UNUSUAL SHORTNESS OF BREATH *UNUSUAL BRUISING OR BLEEDING *URINARY PROBLEMS (pain or burning when urinating, or frequent urination) *BOWEL PROBLEMS (unusual diarrhea, constipation, pain near the anus) TENDERNESS IN MOUTH AND THROAT WITH OR WITHOUT PRESENCE OF ULCERS (sore throat, sores in mouth, or a toothache) UNUSUAL RASH, SWELLING OR PAIN  UNUSUAL VAGINAL DISCHARGE OR ITCHING   Items with * indicate a potential emergency and should be followed up as soon as possible or go to the Emergency Department if any problems should occur.  Please show the CHEMOTHERAPY ALERT CARD or IMMUNOTHERAPY  ALERT CARD at check-in to the Emergency Department and triage nurse.  Should you have questions after your visit or need to cancel or reschedule your appointment, please contact [facility] - A DEPT OF [facility]  Dept: [phone]  and follow the prompts.  Office hours are 8:00 a.m. to 4:30 p.m. Monday - Friday. Please note that voicemails left after 4:00 p.m. may not be returned until the following business day.  We are closed weekends and major holidays. You have access to a nurse at all times for urgent questions. Please call the main number to the clinic Dept: [phone] and follow the prompts.   For any non-urgent questions, you may also contact your provider using MyChart. We now offer e-Visits for anyone 72 and older to request care online for non-urgent symptoms. For details visit [email or web address].   Also download the MyChart app! Go to the app store, search "MyChart", open the app, select [facility], and log in with your MyChart username and password.

## 2023-10-04 ENCOUNTER — Encounter: Payer: Self-pay | Admitting: Hematology

## 2023-10-05 DIAGNOSIS — K5900 Constipation, unspecified: Secondary | ICD-10-CM | POA: Diagnosis not present

## 2023-10-05 DIAGNOSIS — K219 Gastro-esophageal reflux disease without esophagitis: Secondary | ICD-10-CM | POA: Diagnosis not present

## 2023-10-05 DIAGNOSIS — C8318 Mantle cell lymphoma, lymph nodes of multiple sites: Secondary | ICD-10-CM | POA: Diagnosis not present

## 2023-10-15 ENCOUNTER — Encounter: Payer: Self-pay | Admitting: Hematology

## 2023-10-15 NOTE — Progress Notes (Signed)
This encounter was created in error - please disregard.

## 2023-10-20 ENCOUNTER — Other Ambulatory Visit: Payer: Self-pay | Admitting: Hematology

## 2023-10-20 DIAGNOSIS — C8310 Mantle cell lymphoma, unspecified site: Secondary | ICD-10-CM

## 2023-10-25 ENCOUNTER — Inpatient Hospital Stay: Payer: PPO

## 2023-10-25 ENCOUNTER — Inpatient Hospital Stay: Payer: PPO | Attending: Hematology

## 2023-10-25 VITALS — BP 144/74 | HR 67 | Temp 98.4°F | Resp 16 | Wt 141.5 lb

## 2023-10-25 DIAGNOSIS — C8313 Mantle cell lymphoma, intra-abdominal lymph nodes: Secondary | ICD-10-CM | POA: Insufficient documentation

## 2023-10-25 DIAGNOSIS — Z5112 Encounter for antineoplastic immunotherapy: Secondary | ICD-10-CM | POA: Diagnosis not present

## 2023-10-25 DIAGNOSIS — Z5111 Encounter for antineoplastic chemotherapy: Secondary | ICD-10-CM | POA: Diagnosis not present

## 2023-10-25 DIAGNOSIS — C8310 Mantle cell lymphoma, unspecified site: Secondary | ICD-10-CM

## 2023-10-25 LAB — CBC WITH DIFFERENTIAL (CANCER CENTER ONLY)
Abs Immature Granulocytes: 0 10*3/uL (ref 0.00–0.07)
Basophils Absolute: 0.1 10*3/uL (ref 0.0–0.1)
Basophils Relative: 1 %
Eosinophils Absolute: 0.3 10*3/uL (ref 0.0–0.5)
Eosinophils Relative: 7 %
HCT: 37.5 % (ref 36.0–46.0)
Hemoglobin: 12.6 g/dL (ref 12.0–15.0)
Immature Granulocytes: 0 %
Lymphocytes Relative: 4 %
Lymphs Abs: 0.2 10*3/uL — ABNORMAL LOW (ref 0.7–4.0)
MCH: 31.2 pg (ref 26.0–34.0)
MCHC: 33.6 g/dL (ref 30.0–36.0)
MCV: 92.8 fL (ref 80.0–100.0)
Monocytes Absolute: 0.6 10*3/uL (ref 0.1–1.0)
Monocytes Relative: 14 %
Neutro Abs: 3.1 10*3/uL (ref 1.7–7.7)
Neutrophils Relative %: 74 %
Platelet Count: 101 10*3/uL — ABNORMAL LOW (ref 150–400)
RBC: 4.04 MIL/uL (ref 3.87–5.11)
RDW: 15.4 % (ref 11.5–15.5)
WBC Count: 4.2 10*3/uL (ref 4.0–10.5)
nRBC: 0 % (ref 0.0–0.2)

## 2023-10-25 LAB — CMP (CANCER CENTER ONLY)
ALT: 16 U/L (ref 0–44)
AST: 23 U/L (ref 15–41)
Albumin: 3.8 g/dL (ref 3.5–5.0)
Alkaline Phosphatase: 63 U/L (ref 38–126)
Anion gap: 4 — ABNORMAL LOW (ref 5–15)
BUN: 9 mg/dL (ref 8–23)
CO2: 31 mmol/L (ref 22–32)
Calcium: 9 mg/dL (ref 8.9–10.3)
Chloride: 107 mmol/L (ref 98–111)
Creatinine: 0.63 mg/dL (ref 0.44–1.00)
GFR, Estimated: >60 mL/min
Glucose, Bld: 146 mg/dL — ABNORMAL HIGH (ref 70–99)
Potassium: 4 mmol/L (ref 3.5–5.1)
Sodium: 142 mmol/L (ref 135–145)
Total Bilirubin: 0.3 mg/dL
Total Protein: 6.5 g/dL (ref 6.5–8.1)

## 2023-10-25 MED ORDER — ACETAMINOPHEN 325 MG PO TABS
650.0000 mg | ORAL_TABLET | Freq: Once | ORAL | Status: AC
  Administered 2023-10-25: 650 mg via ORAL
  Filled 2023-10-25: qty 2

## 2023-10-25 MED ORDER — PALONOSETRON HCL INJECTION 0.25 MG/5ML
0.2500 mg | Freq: Once | INTRAVENOUS | Status: AC
  Administered 2023-10-25: 0.25 mg via INTRAVENOUS
  Filled 2023-10-25: qty 5

## 2023-10-25 MED ORDER — DIPHENHYDRAMINE HCL 25 MG PO CAPS
50.0000 mg | ORAL_CAPSULE | Freq: Once | ORAL | Status: AC
  Administered 2023-10-25: 50 mg via ORAL
  Filled 2023-10-25: qty 2

## 2023-10-25 MED ORDER — SODIUM CHLORIDE 0.9 % IV SOLN
70.0000 mg/m2 | Freq: Once | INTRAVENOUS | Status: AC
  Administered 2023-10-25: 122.5 mg via INTRAVENOUS
  Filled 2023-10-25: qty 4.9

## 2023-10-25 MED ORDER — SODIUM CHLORIDE 0.9 % IV SOLN: INTRAVENOUS | Status: DC

## 2023-10-25 MED ORDER — SODIUM CHLORIDE 0.9 % IV SOLN
375.0000 mg/m2 | Freq: Once | INTRAVENOUS | Status: AC
  Administered 2023-10-25: 700 mg via INTRAVENOUS
  Filled 2023-10-25: qty 50

## 2023-10-25 MED ORDER — DEXAMETHASONE SODIUM PHOSPHATE 10 MG/ML IJ SOLN
10.0000 mg | Freq: Once | INTRAMUSCULAR | Status: AC
  Administered 2023-10-25: 10 mg via INTRAVENOUS
  Filled 2023-10-25: qty 1

## 2023-10-25 NOTE — Patient Instructions (Signed)
[facility]  Discharge Instructions: Thank you for choosing [facility] to provide your oncology and hematology care.   If you have a lab appointment with the Cancer Center, please go directly to the Cancer Center and check in at the registration area.   Wear comfortable clothing and clothing appropriate for easy access to any Portacath or PICC line.   We strive to give you quality time with your provider. You may need to reschedule your appointment if you arrive late (15 or more minutes).  Arriving late affects you and other patients whose appointments are after yours.  Also, if you miss three or more appointments without notifying the office, you may be dismissed from the clinic at the provider's discretion.      For prescription refill requests, have your pharmacy contact our office and allow 72 hours for refills to be completed.    Today you received the following chemotherapy and/or immunotherapy agents: Rituximab and Bendamustine     To help prevent nausea and vomiting after your treatment, we encourage you to take your nausea medication as directed.  BELOW ARE SYMPTOMS THAT SHOULD BE REPORTED IMMEDIATELY: *FEVER GREATER THAN 100.4 F (38 C) OR HIGHER *CHILLS OR SWEATING *NAUSEA AND VOMITING THAT IS NOT CONTROLLED WITH YOUR NAUSEA MEDICATION *UNUSUAL SHORTNESS OF BREATH *UNUSUAL BRUISING OR BLEEDING *URINARY PROBLEMS (pain or burning when urinating, or frequent urination) *BOWEL PROBLEMS (unusual diarrhea, constipation, pain near the anus) TENDERNESS IN MOUTH AND THROAT WITH OR WITHOUT PRESENCE OF ULCERS (sore throat, sores in mouth, or a toothache) UNUSUAL RASH, SWELLING OR PAIN  UNUSUAL VAGINAL DISCHARGE OR ITCHING   Items with * indicate a potential emergency and should be followed up as soon as possible or go to the Emergency Department if any problems should occur.  Please show the CHEMOTHERAPY ALERT CARD  or IMMUNOTHERAPY ALERT CARD at check-in to the Emergency Department and triage nurse.  Should you have questions after your visit or need to cancel or reschedule your appointment, please contact [facility]  Dept: [phone]  and follow the prompts.  Office hours are 8:00 a.m. to 4:30 p.m. Monday - Friday. Please note that voicemails left after 4:00 p.m. may not be returned until the following business day.  We are closed weekends and major holidays. You have access to a nurse at all times for urgent questions. Please call the main number to the clinic Dept: [phone] and follow the prompts.   For any non-urgent questions, you may also contact your provider using MyChart. We now offer e-Visits for anyone 50 and older to request care online for non-urgent symptoms. For details visit [email or web address].   Also download the MyChart app! Go to the app store, search "MyChart", open the app, select [place], and log in with your MyChart username and password.  Rituximab Injection What is this medication? RITUXIMAB (ri TUX i mab) treats leukemia and lymphoma. It works by blocking a protein that causes cancer cells to grow and multiply. This helps to slow or stop the spread of cancer cells. It may also be used to treat autoimmune conditions, such as arthritis. It works by slowing down an overactive immune system. It is a monoclonal antibody. This medicine may be used for other purposes; ask your health care provider or pharmacist if you have questions. COMMON BRAND NAME(S): RIABNI, Rituxan, RUXIENCE,  truxima What should I tell my care team before I take this medication? They need to know if you have any of these conditions: Chest pain Heart disease Immune system problems Infection, such as chickenpox, cold sores, hepatitis B, herpes Irregular heartbeat or rhythm Kidney disease Low blood counts, such as low white cells, platelets, red  cells Lung disease Recent or upcoming vaccine An unusual or allergic reaction to rituximab, other medications, foods, dyes, or preservatives Pregnant or trying to get pregnant Breast-feeding How should I use this medication? This medication is injected into a vein. It is given by a care team in a hospital or clinic setting. A special MedGuide will be given to you before each treatment. Be sure to read this information carefully each time. Talk to your care team about the use of this medication in children. While this medication may be prescribed for children as young as 6 months for selected conditions, precautions do apply. Overdosage: If you think you have taken too much of this medicine contact a poison control center or emergency room at once. NOTE: This medicine is only for you. Do not share this medicine with others. What if I miss a dose? Keep appointments for follow-up doses. It is important not to miss your dose. Call your care team if you are unable to keep an appointment. What may interact with this medication? Do not take this medication with any of the following: Live vaccines This medication may also interact with the following: Cisplatin This list may not describe all possible interactions. Give your health care provider a list of all the medicines, herbs, non-prescription drugs, or dietary supplements you use. Also tell them if you smoke, drink alcohol, or use illegal drugs. Some items may interact with your medicine. What should I watch for while using this medication? Your condition will be monitored carefully while you are receiving this medication. You may need blood work while taking this medication. This medication can cause serious infusion reactions. To reduce the risk your care team may give you other medications to take before receiving this one. Be sure to follow the directions from your care team. This medication may increase your risk of getting an infection. Call  your care team for advice if you get a fever, chills, sore throat, or other symptoms of a cold or flu. Do not treat yourself. Try to avoid being around people who are sick. Call your care team if you are around anyone with measles, chickenpox, or if you develop sores or blisters that do not heal properly. Avoid taking medications that contain aspirin, acetaminophen, ibuprofen, naproxen, or ketoprofen unless instructed by your care team. These medications may hide a fever. This medication may cause serious skin reactions. They can happen weeks to months after starting the medication. Contact your care team right away if you notice fevers or flu-like symptoms with a rash. The rash may be red or purple and then turn into blisters or peeling of the skin. You may also notice a red rash with swelling of the face, lips, or lymph nodes in your neck or under your arms. In some patients, this medication may cause a serious brain infection that may cause death. If you have any problems seeing, thinking, speaking, walking, or standing, tell your care team right away. If you cannot reach your care team, urgently seek another source of medical care. Talk to your care team if you may be pregnant. Serious birth defects can occur if you take this medication during  pregnancy and for 12 months after the last dose. You will need a negative pregnancy test before starting this medication. Contraception is recommended while taking this medication and for 12 months after the last dose. Your care team can help you find the option that works for you. Do not breastfeed while taking this medication and for at least 6 months after the last dose. What side effects may I notice from receiving this medication? Side effects that you should report to your care team as soon as possible: Allergic reactions or angioedema--skin rash, itching or hives, swelling of the face, eyes, lips, tongue, arms, or legs, trouble swallowing or  breathing Bowel blockage--stomach cramping, unable to have a bowel movement or pass gas, loss of appetite, vomiting Dizziness, loss of balance or coordination, confusion or trouble speaking Heart attack--pain or tightness in the chest, shoulders, arms, or jaw, nausea, shortness of breath, cold or clammy skin, feeling faint or lightheaded Heart rhythm changes--fast or irregular heartbeat, dizziness, feeling faint or lightheaded, chest pain, trouble breathing Infection--fever, chills, cough, sore throat, wounds that don't heal, pain or trouble when passing urine, general feeling of discomfort or being unwell Infusion reactions--chest pain, shortness of breath or trouble breathing, feeling faint or lightheaded Kidney injury--decrease in the amount of urine, swelling of the ankles, hands, or feet Liver injury--right upper belly pain, loss of appetite, nausea, light-colored stool, dark yellow or brown urine, yellowing skin or eyes, unusual weakness or fatigue Redness, blistering, peeling, or loosening of the skin, including inside the mouth Stomach pain that is severe, does not go away, or gets worse Tumor lysis syndrome (TLS)--nausea, vomiting, diarrhea, decrease in the amount of urine, dark urine, unusual weakness or fatigue, confusion, muscle pain or cramps, fast or irregular heartbeat, joint pain Side effects that usually do not require medical attention (report to your care team if they continue or are bothersome): Headache Joint pain Nausea Runny or stuffy nose Unusual weakness or fatigue This list may not describe all possible side effects. Call your doctor for medical advice about side effects. You may report side effects to FDA at 1-800-FDA-1088. Where should I keep my medication? This medication is given in a hospital or clinic. It will not be stored at home. NOTE: This sheet is a summary. It may not cover all possible information. If you have questions about this medicine, talk to your  doctor, pharmacist, or health care provider.  2024 Elsevier/Gold Standard (2022-03-23 00:00:00) Bendamustine Injection What is this medication? BENDAMUSTINE (BEN da MUS teen) treats leukemia and lymphoma. It works by slowing down the growth of cancer cells. This medicine may be used for other purposes; ask your health care provider or pharmacist if you have questions. COMMON BRAND NAME(S): Marilynne Halsted, VIVIMUSTA What should I tell my care team before I take this medication? They need to know if you have any of these conditions: Infection, especially a viral infection, such as chickenpox, cold sores, herpes Kidney disease Liver disease An unusual or allergic reaction to bendamustine, mannitol, other medications, foods, dyes, or preservatives Pregnant or trying to get pregnant Breast-feeding How should I use this medication? This medication is injected into a vein. It is given by your care team in a hospital or clinic setting. Talk to your care team about the use of this medication in children. Special care may be needed. Overdosage: If you think you have taken too much of this medicine contact a poison control center or emergency room at once. NOTE: This medicine is only  for you. Do not share this medicine with others. What if I miss a dose? Keep appointments for follow-up doses. It is important not to miss your dose. Call your care team if you are unable to keep an appointment. What may interact with this medication? Do not take this medication with any of the following: Clozapine This medication may also interact with the following: Atazanavir Cimetidine Ciprofloxacin Enoxacin Fluvoxamine Medications for seizures, such as carbamazepine, phenobarbital Mexiletine Rifampin Tacrine Thiabendazole Zileuton This list may not describe all possible interactions. Give your health care provider a list of all the medicines, herbs, non-prescription drugs, or dietary  supplements you use. Also tell them if you smoke, drink alcohol, or use illegal drugs. Some items may interact with your medicine. What should I watch for while using this medication? Visit your care team for regular checks on your progress. This medication may make you feel generally unwell. This is not uncommon, as chemotherapy can affect healthy cells as well as cancer cells. Report any side effects. Continue your course of treatment even though you feel ill unless your care team tells you to stop. You may need blood work while taking this medication. This medication may increase your risk of getting an infection. Call your care team for advice if you get a fever, chills, sore throat, or other symptoms of a cold or flu. Do not treat yourself. Try to avoid being around people who are sick. This medication may cause serious skin reactions. They can happen weeks to months after starting the medication. Contact your care team right away if you notice fevers or flu-like symptoms with a rash. The rash may be red or purple and then turn into blisters or peeling of the skin. You may also notice a red rash with swelling of the face, lips, or lymph nodes in your neck or under your arms. In some patients, this medication may cause a serious brain infection that may cause death. If you have any problems seeing, thinking, speaking, walking, or standing, tell your care team right away. If you cannot reach your care team, urgently seek other source of medical care. This medication may increase your risk to bruise or bleed. Call your care team if you notice any unusual bleeding. Talk to your care team about your risk of cancer. You may be more at risk for certain types of cancer if you take this medication. Talk to your care team about your risk of skin cancer. You may be more at risk for skin cancer if you take this medication. Talk to your care team if you or your partner wish to become pregnant or think either of  you might be pregnant. This medication can cause serious birth defects if taken during pregnancy or for up to 6 months after the last dose. A negative pregnancy test is required before starting this medication. A reliable form of contraception is recommended while taking this medication and for 6 months after the last dose. Talk to your care team about reliable forms of contraception. Wear a condom while taking this medication and for at least 3 months after the last dose. Do not breast-feed while taking this medication or for at least 1 week after the last dose. This medication may cause infertility. Talk to your care team if you are concerned about your fertility. What side effects may I notice from receiving this medication? Side effects that you should report to your care team as soon as possible: Allergic reactions--skin rash, itching, hives,  swelling of the face, lips, tongue, or throat Infection--fever, chills, cough, sore throat, wounds that don't heal, pain or trouble when passing urine, general feeling of discomfort or being unwell Infusion reactions--chest pain, shortness of breath or trouble breathing, feeling faint or lightheaded Liver injury--right upper belly pain, loss of appetite, nausea, light-colored stool, dark yellow or brown urine, yellowing skin or eyes, unusual weakness or fatigue Low red blood cell level--unusual weakness or fatigue, dizziness, headache, trouble breathing Painful swelling, warmth, or redness of the skin, blisters or sores at the infusion site Rash, fever, and swollen lymph nodes Redness, blistering, peeling, or loosening of the skin, including inside the mouth Tumor lysis syndrome (TLS)--nausea, vomiting, diarrhea, decrease in the amount of urine, dark urine, unusual weakness or fatigue, confusion, muscle pain or cramps, fast or irregular heartbeat, joint pain Unusual bruising or bleeding Side effects that usually do not require medical attention (report to  your care team if they continue or are bothersome): Diarrhea Fatigue Headache Loss of appetite Nausea Vomiting This list may not describe all possible side effects. Call your doctor for medical advice about side effects. You may report side effects to FDA at 1-800-FDA-1088. Where should I keep my medication? This medication is given in a hospital or clinic. It will not be stored at home. NOTE: This sheet is a summary. It may not cover all possible information. If you have questions about this medicine, talk to your doctor, pharmacist, or health care provider.  2024 Elsevier/Gold Standard (2022-02-21 00:00:00)

## 2023-10-26 ENCOUNTER — Inpatient Hospital Stay: Payer: PPO

## 2023-10-26 ENCOUNTER — Inpatient Hospital Stay: Payer: PPO | Admitting: Hematology

## 2023-10-26 VITALS — BP 148/60 | HR 93 | Temp 97.7°F | Resp 18 | Wt 146.1 lb

## 2023-10-26 DIAGNOSIS — C8310 Mantle cell lymphoma, unspecified site: Secondary | ICD-10-CM | POA: Diagnosis not present

## 2023-10-26 DIAGNOSIS — Z5111 Encounter for antineoplastic chemotherapy: Secondary | ICD-10-CM | POA: Diagnosis not present

## 2023-10-26 MED ORDER — DEXAMETHASONE SODIUM PHOSPHATE 10 MG/ML IJ SOLN
10.0000 mg | Freq: Once | INTRAMUSCULAR | Status: AC
  Administered 2023-10-26: 10 mg via INTRAVENOUS
  Filled 2023-10-26: qty 1

## 2023-10-26 MED ORDER — SODIUM CHLORIDE 0.9% FLUSH: 10.0000 mL | INTRAVENOUS | Status: DC | PRN

## 2023-10-26 MED ORDER — SODIUM CHLORIDE 0.9 % IV SOLN: INTRAVENOUS | Status: DC

## 2023-10-26 MED ORDER — SODIUM CHLORIDE 0.9 % IV SOLN
70.0000 mg/m2 | Freq: Once | INTRAVENOUS | Status: AC
  Administered 2023-10-26: 122.5 mg via INTRAVENOUS
  Filled 2023-10-26: qty 4.9

## 2023-10-26 NOTE — Patient Instructions (Signed)
 [facility]  Discharge Instructions: Thank you for choosing [facility] to provide your oncology and hematology care.   If you have a lab appointment with the Cancer Center, please go directly to the Cancer Center and check in at the registration area.   Wear comfortable clothing and clothing appropriate for easy access to any Portacath or PICC line.   We strive to give you quality time with your provider. You may need to reschedule your appointment if you arrive late (15 or more minutes).  Arriving late affects you and other patients whose appointments are after yours.  Also, if you miss three or more appointments without notifying the office, you may be dismissed from the clinic at the provider's discretion.      For prescription refill requests, have your pharmacy contact our office and allow 72 hours for refills to be completed.    Today you received the following chemotherapy and/or immunotherapy agents: Bendeka      To help prevent nausea and vomiting after your treatment, we encourage you to take your nausea medication as directed.  BELOW ARE SYMPTOMS THAT SHOULD BE REPORTED IMMEDIATELY: *FEVER GREATER THAN 100.4 F (38 C) OR HIGHER *CHILLS OR SWEATING *NAUSEA AND VOMITING THAT IS NOT CONTROLLED WITH YOUR NAUSEA MEDICATION *UNUSUAL SHORTNESS OF BREATH *UNUSUAL BRUISING OR BLEEDING *URINARY PROBLEMS (pain or burning when urinating, or frequent urination) *BOWEL PROBLEMS (unusual diarrhea, constipation, pain near the anus) TENDERNESS IN MOUTH AND THROAT WITH OR WITHOUT PRESENCE OF ULCERS (sore throat, sores in mouth, or a toothache) UNUSUAL RASH, SWELLING OR PAIN  UNUSUAL VAGINAL DISCHARGE OR ITCHING   Items with * indicate a potential emergency and should be followed up as soon as possible or go to the Emergency Department if any problems should occur.  Please show the CHEMOTHERAPY ALERT CARD or IMMUNOTHERAPY  ALERT CARD at check-in to the Emergency Department and triage nurse.  Should you have questions after your visit or need to cancel or reschedule your appointment, please contact [facility]  Dept: [phone]  and follow the prompts.  Office hours are 8:00 a.m. to 4:30 p.m. Monday - Friday. Please note that voicemails left after 4:00 p.m. may not be returned until the following business day.  We are closed weekends and major holidays. You have access to a nurse at all times for urgent questions. Please call the main number to the clinic Dept: [phone] and follow the prompts.   For any non-urgent questions, you may also contact your provider using MyChart. We now offer e-Visits for anyone 73 and older to request care online for non-urgent symptoms. For details visit [email or web address].   Also download the MyChart app! Go to the app store, search "MyChart", open the app, select [place], and log in with your MyChart username and password.

## 2023-10-26 NOTE — Progress Notes (Signed)
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   HISTORY OF PRESENTING ILLNESS:   Courtney House is a wonderful 77 y.o. female who has been referred to [place] by Kathi Der, MD for evaluation and management of Mantle cell lymphoma.   Patient reports having a history of IBS for several years with no recent symptoms. Her bowel symptoms, including diarrhea and abdominal cramping, have been stable and managed with Hyoscyamine.    She reports stable lack of appetite over the last few years. She notes that her weight fluctuates but denies any significant weight loss.   She denies any history of polyps in the past prior to her recent routine colonoscopy with Dr. Levora Angel which showed multiple polyps which were noted to be tubular adenomas, however the mass in sigmoid colon which was reported to look like a "lipoma" on colonoscopy on biopsy turned out to show a mantle cell lymphoma.  Her previous colonoscopy was likely about 5-6 years ago, though she admits her timeline may be inaccurate.  Patient has stable bilateral leg swelling and occasional lower back pain. She denies any sudden weight loss, fever, chills, night sweats, new lumps/bumps, or swallowing issues.   She reports occasional episodes of feeling flushed with itchiness and redness in her face/head. These episodes have been occurring since a year ago. She does not tend to get acid reflux.  Patient denies any heart, lung, liver, or kidney issues. Patient is able to stay active with no significant physical limitations. She does note having neuropathy in her bilateral lower extremities, which she controls with Tramadol and Gabapentin, managed by neurology.  Patient is a never smoker and denies any alcohol use.  In regards to her fhx, she  reports that her sister has breast and colon cancer. Also, her mother has a history of brain tumor, her father has CHF, her brother has throat cancer, and her maternal uncle was diagnosed with mouth cancer and was a frequent smoker. She notes that her brother was a smoker, but likely did not have heavy use.   INTERVAL HISTORY:  Courtney House is a 77 y.o. female here for continued evaluation and management of mantle cell lymphoma. She was last seen by me on 09/28/2023 and complained of fatigue, low energy, bilateral leg swelling R>L, ankle pain attributed to neuropathy, and some constipation.   Today, she returns for toxicity check prior to cycle 3 day 2 of treatment.   Patient continues to manage meals at home. Patient complains of lack of appetite, but notes mildly improved eating habits. Patient has gained a couple of pounds recently and currently weighs 146 pounds.   Patient complains of mild fatigue. Patient reports that she generally tries to stay physically active.   She denies any fever, chills, infection issues, abdominal pain, or new lumps/bumps.  She complains of dry mouth and lack of taste, but denies any mouth sores. Patient reports that she generally does stay well-hydrated. She notes that soda does cause stomach issues.   Patient complains of plenty of constipation after receiving treatment. She reports that she is planning to use stool softener to manage symptoms. She reports that Mirolax tends to trigger IBS. Patient continues to be on Levsin for IBS management.   She reports that when she is driving or walking for extended periods, she endorses pain  in her right foot.   Patient reports that she previously took two tablets of Tramadol for pain management, and only requires one tablet at a time currently. She notes that Tylenol does not generally manage pain.   She has not yet received her age-appropriate vaccines recently. She notes that she did previously endorse chills with  her previous flu shots. Patient notes that she has not received a flu shot in three years.   Patient reports some mild hair loss.   MEDICAL HISTORY:  Past Medical History:  Diagnosis Date   Anxiety    Carpal tunnel syndrome    Depression    Hypercholesterolemia    IBS (irritable bowel syndrome)    Insomnia    Sensory disease or syndrome 05/05/2014    SURGICAL HISTORY: Past Surgical History:  Procedure Laterality Date   CARPAL TUNNEL RELEASE Right    TUBAL LIGATION  1974    SOCIAL HISTORY: Social History   Socioeconomic History   Marital status: Widowed    Spouse name: Not on file   Number of children: 2   Years of education: 80   Highest education level: Not on file  Occupational History   Occupation: [employer]  Tobacco Use   Smoking status: Never   Smokeless tobacco: Never  Substance and Sexual Activity   Alcohol use: No    Alcohol/week: 0.0 standard drinks of alcohol   Drug use: No   Sexual activity: Not on file  Other Topics Concern   Not on file  Social History Narrative   Patient is widowed and her daughter and grandson lives with her.   Patient does drink one glass of tea daily.   Patient works on an  Theatre stage manager.   Patient has a high school education.   Patient has two adult children.   Patient is right-handed.                  Social Drivers of Corporate investment banker Strain: Not on file  Food Insecurity: Not on file  Transportation Needs: Not on file  Physical Activity: Not on file  Stress: Not on file  Social Connections: Not on file  Intimate Partner Violence: Not on file    FAMILY HISTORY: Family History  Problem Relation Age of Onset   Breast cancer Sister        lymph nodes   Parkinson's disease Brother    Throat cancer Brother    Macular degeneration Brother     ALLERGIES:  has no known allergies.  MEDICATIONS:  Current Outpatient Medications  Medication Sig Dispense Refill   acyclovir (ZOVIRAX) 400 MG tablet Take 1  tablet (400 mg total) by mouth daily. 30 tablet 3   allopurinol (ZYLOPRIM) 300 MG tablet TAKE 1/2 TABLET(150 MG) BY MOUTH DAILY 30 tablet 0   calcium gluconate 650 MG tablet Take 650 mg by mouth daily.     cholecalciferol (VITAMIN D) 1000 UNITS tablet Take 1,000 Units by mouth daily.     dexamethasone (DECADRON) 4 MG tablet Take 2 tablets (8 mg total) by mouth daily. Start the day after bendamustine chemotherapy for 2 days. Take with food. 30 tablet 1   gabapentin (NEURONTIN) 400 MG capsule TAKE ONE CAPSULE BY MOUTH IN THE MORNING AND TAKE 1 CAPSULE IN EVENING AND TAKE 2 CAPSULES AT BEDTIME 120 capsule 0   hyoscyamine (LEVSIN, ANASPAZ) 0.125 MG tablet Take 0.125 mg by mouth every 4 (four) hours as needed (abdominal pain).  ondansetron (ZOFRAN) 8 MG tablet Take 1 tablet (8 mg total) by mouth every 8 (eight) hours as needed for nausea or vomiting. Start on the third day after chemotherapy. 30 tablet 1   Prenatal Vit-Fe Fumarate-FA (PRENATAL VITAMINS) 28-0.8 MG TABS Take 28 mg by mouth 1 day or 1 dose. (Patient taking differently: Take 28 mg by mouth daily.) 30 tablet 5   prochlorperazine (COMPAZINE) 10 MG tablet Take 1 tablet (10 mg total) by mouth every 6 (six) hours as needed for nausea or vomiting. 30 tablet 1   temazepam (RESTORIL) 15 MG capsule Take 15 mg by mouth at bedtime as needed for sleep.     traMADol (ULTRAM) 50 MG tablet Take 50 mg by mouth 2 (two) times daily. 50mg  in am and 50mg  in pm     No current facility-administered medications for this visit.    REVIEW OF SYSTEMS:    10 Point review of Systems was done is negative except as noted above.   PHYSICAL EXAMINATION: ECOG PERFORMANCE STATUS: 1 - Symptomatic but completely ambulatory  . Vitals:   10/26/23 1258  BP: (!) 148/60  Pulse: 93  Resp: 18  Temp: 97.7 F (36.5 C)  SpO2: 99%   Filed Weights   10/26/23 1258  Weight: 146 lb 1.6 oz (66.3 kg)   .Body mass index is 23.58 kg/m. GENERAL:alert, in no acute  distress and comfortable SKIN: no acute rashes, no significant lesions EYES: conjunctiva are pink and non-injected, sclera anicteric OROPHARYNX: MMM, no exudates, no oropharyngeal erythema or ulceration NECK: supple, no JVD LYMPH:  no palpable lymphadenopathy in the cervical, axillary or inguinal regions LUNGS: clear to auscultation b/l with normal respiratory effort HEART: regular rate & rhythm ABDOMEN:  normoactive bowel sounds , non tender, not distended. Extremity: no pedal edema PSYCH: alert & oriented x 3 with fluent speech NEURO: no focal motor/sensory deficits   LABORATORY DATA:  I have reviewed the data as listed  .    Latest Ref Rng & Units 10/25/2023   12:13 PM 09/27/2023   12:41 PM 09/14/2023    9:51 AM  CBC  WBC 4.0 - 10.5 K/uL 4.2  4.9  6.0   Hemoglobin 12.0 - 15.0 g/dL 57.8  46.9  62.9   Hematocrit 36.0 - 46.0 % 37.5  38.2  36.0   Platelets 150 - 400 K/uL 101  146  153     .    Latest Ref Rng & Units 10/25/2023   12:13 PM 09/27/2023   12:41 PM 09/14/2023    9:51 AM  CMP  Glucose 70 - 99 mg/dL 528  413  244   BUN 8 - 23 mg/dL 9  8  8    Creatinine 0.44 - 1.00 mg/dL 0.10  2.72  5.36   Sodium 135 - 145 mmol/L 142  145  138   Potassium 3.5 - 5.1 mmol/L 4.0  3.9  3.7   Chloride 98 - 111 mmol/L 107  106  103   CO2 22 - 32 mmol/L 31  34  31   Calcium 8.9 - 10.3 mg/dL 9.0  9.2  8.8   Total Protein 6.5 - 8.1 g/dL 6.5  6.6  6.5   Total Bilirubin <1.2 mg/dL 0.3  0.3  0.7   Alkaline Phos 38 - 126 U/L 63  62  52   AST 15 - 41 U/L 23  16  27    ALT 0 - 44 U/L 16  10  22  RADIOGRAPHIC STUDIES: I have personally reviewed the radiological images as listed and agreed with the findings in the report. No results found.  ASSESSMENT & PLAN:   77 y.o. female with:  Mantle cell lymphoma- newly diagnosed. Found as mass in the sigmoid colon on colonoscopy done to evaluate for Fhx of colon cancer and chronic IBS like symptoms. IBS Fhx of breast and colon cancer.  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 CBC showed WBC of 4.2K, hemoglobin of 12.6, and platelets of 101K. -WBCs normal and hemoglobin improved -platelets are slightly low, though not concerning at this time -CMP stable with no signs of dehydration -patient denies any major significant toxicity from BR treatment -patient is appropriate to proceed with cycle 3 day 2 of Bendamustine and Rituxan treatment today with same supportive medications -will plan to repeat PET scan after her third cycle of treatment to evaluate her treatment response -no signs of thrush during physical examination -recommend mixing and gargling 16 ounces of water with 1 tsp of salt and 1 tsp of baking soda. Recommend gargling 3-4 times a day for 3-4 days after receiving chemotherapy to improve dry mouth/lack of taste/mucositis  -educated patient that Levsin can be constipating -discussed option to cut Levsin to one tablet a day if she experiences bothersome cramping -recommend taking OTC Miralax once daily for constipation management. She may also use a stool softener if needed -Okay to take Tylenol intermittently for pain management -educated patient that Ibuprofen is known to affect platelets. Okay to take ibuprofen occasionally.  -stop allopurinol -continue to stay physically active to improve fatigue -continue to stay well-hydrated -Recommend patient to stay UTD with her age-appropriate vaccines -patient expresses some concern regarding mild hair loss. Recommend to minimize hair treatments as it can increase hair loss -answered all of patient's questions in detail  FOLLOW-UP: -Plz schedule C4 of BR as per integrated scheduling with labs and MD visit -PET/CT in 2-3 weeks  The total time spent in the appointment was 32 minutes* .  All of the patient's questions were answered with apparent satisfaction.  The patient knows to call the clinic with any problems, questions or concerns.   Wyvonnia Lora MD MS AAHIVMS [facility] Hematology/Oncology Physician [facility]  .*Total Encounter Time as defined by the Centers for Medicare and Medicaid Services includes, in addition to the face-to-face time of a patient visit (documented in the note above) non-face-to-face time: obtaining and reviewing outside history, ordering and reviewing medications, tests or procedures, care coordination (communications with other health care professionals or caregivers) and documentation in the medical record.    I,Mitra Faeizi,acting as a Neurosurgeon for Wyvonnia Lora, MD.,have documented all relevant documentation on the behalf of Wyvonnia Lora, MD,as directed by  Wyvonnia Lora, MD while in the presence of Wyvonnia Lora, MD.  .I have reviewed the above documentation for accuracy and completeness, and I agree with the above. Johney Maine MD

## 2023-10-26 NOTE — Progress Notes (Signed)
Patient seen by Dr. Addison Naegeli are within treatment parameters.  Labs reviewed: and are within treatment parameters. Labs done 10/25/23  Per physician team, patient is ready for treatment and there are NO modifications to the treatment plan.

## 2023-10-30 ENCOUNTER — Other Ambulatory Visit: Payer: Self-pay

## 2023-11-01 ENCOUNTER — Encounter: Payer: Self-pay | Admitting: Hematology

## 2023-11-15 ENCOUNTER — Other Ambulatory Visit: Payer: Self-pay

## 2023-11-15 DIAGNOSIS — C8310 Mantle cell lymphoma, unspecified site: Secondary | ICD-10-CM

## 2023-11-21 ENCOUNTER — Inpatient Hospital Stay: Payer: PPO

## 2023-11-21 ENCOUNTER — Inpatient Hospital Stay: Payer: PPO | Admitting: Hematology

## 2023-11-21 ENCOUNTER — Inpatient Hospital Stay: Payer: PPO | Attending: Hematology

## 2023-11-21 VITALS — BP 148/86 | HR 89 | Temp 98.1°F | Resp 18 | Ht 66.0 in | Wt 140.9 lb

## 2023-11-21 DIAGNOSIS — C8310 Mantle cell lymphoma, unspecified site: Secondary | ICD-10-CM | POA: Diagnosis not present

## 2023-11-21 DIAGNOSIS — C8313 Mantle cell lymphoma, intra-abdominal lymph nodes: Secondary | ICD-10-CM | POA: Diagnosis not present

## 2023-11-21 DIAGNOSIS — Z5111 Encounter for antineoplastic chemotherapy: Secondary | ICD-10-CM | POA: Diagnosis not present

## 2023-11-21 DIAGNOSIS — K589 Irritable bowel syndrome without diarrhea: Secondary | ICD-10-CM | POA: Diagnosis not present

## 2023-11-21 DIAGNOSIS — Z5112 Encounter for antineoplastic immunotherapy: Secondary | ICD-10-CM | POA: Insufficient documentation

## 2023-11-21 DIAGNOSIS — Z800 Family history of malignant neoplasm of digestive organs: Secondary | ICD-10-CM | POA: Insufficient documentation

## 2023-11-21 DIAGNOSIS — Z803 Family history of malignant neoplasm of breast: Secondary | ICD-10-CM | POA: Insufficient documentation

## 2023-11-21 DIAGNOSIS — K1379 Other lesions of oral mucosa: Secondary | ICD-10-CM | POA: Insufficient documentation

## 2023-11-21 LAB — CBC WITH DIFFERENTIAL (CANCER CENTER ONLY)
Abs Immature Granulocytes: 0.01 10*3/uL (ref 0.00–0.07)
Basophils Absolute: 0.1 10*3/uL (ref 0.0–0.1)
Basophils Relative: 2 %
Eosinophils Absolute: 0.2 10*3/uL (ref 0.0–0.5)
Eosinophils Relative: 7 %
HCT: 39.1 % (ref 36.0–46.0)
Hemoglobin: 13.2 g/dL (ref 12.0–15.0)
Immature Granulocytes: 0 %
Lymphocytes Relative: 6 %
Lymphs Abs: 0.2 10*3/uL — ABNORMAL LOW (ref 0.7–4.0)
MCH: 30.8 pg (ref 26.0–34.0)
MCHC: 33.8 g/dL (ref 30.0–36.0)
MCV: 91.4 fL (ref 80.0–100.0)
Monocytes Absolute: 0.5 10*3/uL (ref 0.1–1.0)
Monocytes Relative: 18 %
Neutro Abs: 1.9 10*3/uL (ref 1.7–7.7)
Neutrophils Relative %: 67 %
Platelet Count: 131 10*3/uL — ABNORMAL LOW (ref 150–400)
RBC: 4.28 MIL/uL (ref 3.87–5.11)
RDW: 14.9 % (ref 11.5–15.5)
WBC Count: 2.9 10*3/uL — ABNORMAL LOW (ref 4.0–10.5)
nRBC: 0 % (ref 0.0–0.2)

## 2023-11-21 LAB — CMP (CANCER CENTER ONLY)
ALT: 15 U/L (ref 0–44)
AST: 25 U/L (ref 15–41)
Albumin: 3.8 g/dL (ref 3.5–5.0)
Alkaline Phosphatase: 64 U/L (ref 38–126)
Anion gap: 7 (ref 5–15)
BUN: 8 mg/dL (ref 8–23)
CO2: 28 mmol/L (ref 22–32)
Calcium: 9.2 mg/dL (ref 8.9–10.3)
Chloride: 109 mmol/L (ref 98–111)
Creatinine: 0.75 mg/dL (ref 0.44–1.00)
GFR, Estimated: >60 mL/min (ref >60)
Glucose, Bld: 125 mg/dL — ABNORMAL HIGH (ref 70–99)
Potassium: 3.9 mmol/L (ref 3.5–5.1)
Sodium: 144 mmol/L (ref 135–145)
Total Bilirubin: 0.4 mg/dL (ref 0.0–1.2)
Total Protein: 6.5 g/dL (ref 6.5–8.1)

## 2023-11-21 MED ORDER — ACETAMINOPHEN 325 MG PO TABS
650.0000 mg | ORAL_TABLET | Freq: Once | ORAL | Status: AC
  Administered 2023-11-21: 650 mg via ORAL
  Filled 2023-11-21: qty 2

## 2023-11-21 MED ORDER — DEXAMETHASONE SODIUM PHOSPHATE 10 MG/ML IJ SOLN
10.0000 mg | Freq: Once | INTRAMUSCULAR | Status: AC
  Administered 2023-11-21: 10 mg via INTRAVENOUS
  Filled 2023-11-21: qty 1

## 2023-11-21 MED ORDER — PALONOSETRON HCL INJECTION 0.25 MG/5ML
0.2500 mg | Freq: Once | INTRAVENOUS | Status: AC
  Administered 2023-11-21: 0.25 mg via INTRAVENOUS
  Filled 2023-11-21: qty 5

## 2023-11-21 MED ORDER — DIPHENHYDRAMINE HCL 25 MG PO CAPS
50.0000 mg | ORAL_CAPSULE | Freq: Once | ORAL | Status: AC
  Administered 2023-11-21: 50 mg via ORAL
  Filled 2023-11-21 (×2): qty 2

## 2023-11-21 MED ORDER — SODIUM CHLORIDE 0.9 % IV SOLN
375.0000 mg/m2 | Freq: Once | INTRAVENOUS | Status: AC
  Administered 2023-11-21: 700 mg via INTRAVENOUS
  Filled 2023-11-21: qty 20

## 2023-11-21 MED ORDER — SODIUM CHLORIDE 0.9 % IV SOLN: INTRAVENOUS | Status: AC

## 2023-11-21 MED ORDER — SODIUM CHLORIDE 0.9 % IV SOLN
70.0000 mg/m2 | Freq: Once | INTRAVENOUS | Status: AC
  Administered 2023-11-21: 122.5 mg via INTRAVENOUS
  Filled 2023-11-21: qty 4.9

## 2023-11-21 NOTE — Progress Notes (Signed)
 HEMATOLOGY/ONCOLOGY CLINIC NOTE  Date of Service: 11/21/23  Patient Care Team: Leonel Cole, MD as PCP - General (Family Medicine)  CHIEF COMPLAINTS/PURPOSE OF CONSULTATION:  Evaluation and management of recently diagnosed mantle cell lymphoma.   HISTORY OF PRESENTING ILLNESS:   Courtney House is a wonderful 78 y.o. female who has been referred to us  by Elicia Claw, MD for evaluation and management of Mantle cell lymphoma.   Patient reports having a history of IBS for several years with no recent symptoms. Her bowel symptoms, including diarrhea and abdominal cramping, have been stable and managed with Hyoscyamine.    She reports stable lack of appetite over the last few years. She notes that her weight fluctuates but denies any significant weight loss.   She denies any history of polyps in the past prior to her recent routine colonoscopy with Dr. Elicia which showed multiple polyps which were noted to be tubular adenomas, however the mass in sigmoid colon which was reported to look like a lipoma on colonoscopy on biopsy turned out to show a mantle cell lymphoma.  Her previous colonoscopy was likely about 5-6 years ago, though she admits her timeline may be inaccurate.  Patient has stable bilateral leg swelling and occasional lower back pain. She denies any sudden weight loss, fever, chills, night sweats, new lumps/bumps, or swallowing issues.   She reports occasional episodes of feeling flushed with itchiness and redness in her face/head. These episodes have been occurring since a year ago. She does not tend to get acid reflux.  Patient denies any heart, lung, liver, or kidney issues. Patient is able to stay active with no significant physical limitations. She does note having neuropathy in her bilateral lower extremities, which she controls with Tramadol and Gabapentin , managed by neurology.  Patient is a never smoker and denies any alcohol use.  In regards to her fhx,  she reports that her sister has breast and colon cancer. Also, her mother has a history of brain tumor, her father has CHF, her brother has throat cancer, and her maternal uncle was diagnosed with mouth cancer and was a frequent smoker. She notes that her brother was a smoker, but likely did not have heavy use.   INTERVAL HISTORY:  Courtney House is a 78 y.o. female here for continued evaluation and management of mantle cell lymphoma. She was last seen by me on 10/26/2023 and reported mild lack of appetite, mild fatigue, dry mouth, lack of taste, constipation, right foot pain walking/driving for extended periods,   Today, she returns for toxicity check prior to cycle 4 day 1 of treatment. Patient complains that she began endorsing mouth sores. She notes that she did not try gurgling salt/baking soda rinses prior to endorsing mouth sores, but did begin rinsing the mixture after her mouth sores presented and her mouth sores resolved 2-3 days after doing so. She reports that she continues to rinse salt and baking soda mixtures 1-2 times a day.   Besides her mouth sores and mild fatigue, she feels that she is tolerating treatment well with no major toxicities. Patient denies her fever, chills, night sweats, new lumps/bumps, new skin rashes, or new leg swelling. Patient reports low p.o. intake.    Patient reports an episode of leg pain, which she attributes to low physical activity, which resolved the next day.   She uses nausea medication occasionally as needed. She denies any abdominal pain.   MEDICAL HISTORY:  Past Medical History:  Diagnosis Date  Anxiety    Carpal tunnel syndrome    Depression    Hypercholesterolemia    IBS (irritable bowel syndrome)    Insomnia    Sensory disease or syndrome 05/05/2014    SURGICAL HISTORY: Past Surgical History:  Procedure Laterality Date   CARPAL TUNNEL RELEASE Right    TUBAL LIGATION  1974    SOCIAL HISTORY: Social History   Socioeconomic  History   Marital status: Widowed    Spouse name: Not on file   Number of children: 2   Years of education: 56   Highest education level: Not on file  Occupational History   Occupation: [employer]  Tobacco Use   Smoking status: Never   Smokeless tobacco: Never  Substance and Sexual Activity   Alcohol use: No    Alcohol/week: 0.0 standard drinks of alcohol   Drug use: No   Sexual activity: Not on file  Other Topics Concern   Not on file  Social History Narrative   Patient is widowed and her daughter and grandson lives with her.   Patient does drink one glass of tea daily.   Patient works on an  theatre stage manager.   Patient has a high school education.   Patient has two adult children.   Patient is right-handed.                  Social Drivers of Corporate Investment Banker Strain: Not on file  Food Insecurity: Not on file  Transportation Needs: Not on file  Physical Activity: Not on file  Stress: Not on file  Social Connections: Not on file  Intimate Partner Violence: Not on file    FAMILY HISTORY: Family History  Problem Relation Age of Onset   Breast cancer Sister        lymph nodes   Parkinson's disease Brother    Throat cancer Brother    Macular degeneration Brother     ALLERGIES:  has no known allergies.  MEDICATIONS:  Current Outpatient Medications  Medication Sig Dispense Refill   acyclovir  (ZOVIRAX ) 400 MG tablet Take 1 tablet (400 mg total) by mouth daily. 30 tablet 3   allopurinol  (ZYLOPRIM ) 300 MG tablet TAKE 1/2 TABLET(150 MG) BY MOUTH DAILY (Patient not taking: Reported on 10/26/2023) 30 tablet 0   calcium gluconate 650 MG tablet Take 650 mg by mouth daily.     cholecalciferol (VITAMIN D ) 1000 UNITS tablet Take 1,000 Units by mouth daily.     dexamethasone  (DECADRON ) 4 MG tablet Take 2 tablets (8 mg total) by mouth daily. Start the day after bendamustine  chemotherapy for 2 days. Take with food. 30 tablet 1   gabapentin  (NEURONTIN ) 400 MG capsule  TAKE ONE CAPSULE BY MOUTH IN THE MORNING AND TAKE 1 CAPSULE IN EVENING AND TAKE 2 CAPSULES AT BEDTIME 120 capsule 0   hyoscyamine (LEVSIN, ANASPAZ) 0.125 MG tablet Take 0.125 mg by mouth every 4 (four) hours as needed (abdominal pain).      ondansetron  (ZOFRAN ) 8 MG tablet Take 1 tablet (8 mg total) by mouth every 8 (eight) hours as needed for nausea or vomiting. Start on the third day after chemotherapy. 30 tablet 1   Prenatal Vit-Fe Fumarate-FA (PRENATAL VITAMINS) 28-0.8 MG TABS Take 28 mg by mouth 1 day or 1 dose. (Patient taking differently: Take 28 mg by mouth daily.) 30 tablet 5   prochlorperazine  (COMPAZINE ) 10 MG tablet Take 1 tablet (10 mg total) by mouth every 6 (six) hours as needed for nausea or  vomiting. 30 tablet 1   temazepam (RESTORIL) 15 MG capsule Take 15 mg by mouth at bedtime as needed for sleep.     traMADol (ULTRAM) 50 MG tablet Take 50 mg by mouth 2 (two) times daily. 50mg  in am and 50mg  in pm     No current facility-administered medications for this visit.    REVIEW OF SYSTEMS:    10 Point review of Systems was done is negative except as noted above.   PHYSICAL EXAMINATION: ECOG PERFORMANCE STATUS: 1 - Symptomatic but completely ambulatory  . Vitals:   11/21/23 1005  BP: (!) 148/86  Pulse: 89  Resp: 18  Temp: 98.1 F (36.7 C)  SpO2: 100%    Filed Weights   11/21/23 1005  Weight: 140 lb 14.4 oz (63.9 kg)   .Body mass index is 22.74 kg/m.   GENERAL:alert, in no acute distress and comfortable SKIN: no acute rashes, no significant lesions EYES: conjunctiva are pink and non-injected, sclera anicteric OROPHARYNX: MMM, no exudates, no oropharyngeal erythema or ulceration NECK: supple, no JVD LYMPH:  no palpable lymphadenopathy in the cervical, axillary or inguinal regions LUNGS: clear to auscultation b/l with normal respiratory effort HEART: regular rate & rhythm ABDOMEN:  normoactive bowel sounds , non tender, not distended. Extremity: no pedal  edema PSYCH: alert & oriented x 3 with fluent speech NEURO: no focal motor/sensory deficits   LABORATORY DATA:  I have reviewed the data as listed  .    Latest Ref Rng & Units 11/21/2023    9:40 AM 10/25/2023   12:13 PM 09/27/2023   12:41 PM  CBC  WBC 4.0 - 10.5 K/uL 2.9  4.2  4.9   Hemoglobin 12.0 - 15.0 g/dL 86.7  87.3  87.7   Hematocrit 36.0 - 46.0 % 39.1  37.5  38.2   Platelets 150 - 400 K/uL 131  101  146     .    Latest Ref Rng & Units 11/21/2023    9:40 AM 10/25/2023   12:13 PM 09/27/2023   12:41 PM  CMP  Glucose 70 - 99 mg/dL 874  853  878   BUN 8 - 23 mg/dL 8  9  8    Creatinine 0.44 - 1.00 mg/dL 9.24  9.36  9.22   Sodium 135 - 145 mmol/L 144  142  145   Potassium 3.5 - 5.1 mmol/L 3.9  4.0  3.9   Chloride 98 - 111 mmol/L 109  107  106   CO2 22 - 32 mmol/L 28  31  34   Calcium 8.9 - 10.3 mg/dL 9.2  9.0  9.2   Total Protein 6.5 - 8.1 g/dL 6.5  6.5  6.6   Total Bilirubin 0.0 - 1.2 mg/dL 0.4  0.3  0.3   Alkaline Phos 38 - 126 U/L 64  63  62   AST 15 - 41 U/L 25  23  16    ALT 0 - 44 U/L 15  16  10       RADIOGRAPHIC STUDIES: I have personally reviewed the radiological images as listed and agreed with the findings in the report. No results found.  ASSESSMENT & PLAN:   78 y.o. female with:  Mantle cell lymphoma- newly diagnosed. Found as mass in the sigmoid colon on colonoscopy done to evaluate for Fhx of colon cancer and chronic IBS like symptoms. IBS Fhx of breast and colon cancer.  Hx of depression/Anxiety H/o Idiopathic neuropathy- on neurontin  and prn tramadol  PLAN:  -Discussed  lab results on 11/21/23  in detail with patient. CBC showed WBC of 2.9K, hemoglobin of 13.2, and platelets of 131K. -lymphocytes are low, though patient is not neutropenic -Hgb normal -platelets improved from 101K on 10/25/2023 to 131K currently -patient reports mild toxicities of fatigue and mouth sores, but denies any major toxicity from BR treatment -patient is appropriate  to proceed with cycle 4 day 1 of Bendamustine  and Rituxan  treatment today with same supportive medications -continue salt and baking soda rinses for prevention of mouth sores -discussed option to suck on a popsicle/ice during Bendamustine  treatment to reduce exposure to the mouth and improve mouth sores -Patient has a PET scan scheduled for 11/29/2023. Will plan for phone visit to discuss PET scan results -continue to stay physically active to improve fatigue -continue to stay well-hydrated  FOLLOW-UP: Pet/ct as scheduled on 11/29/2023 Plz schedule C5 and C6 of BR as per integrated scheduling  The total time spent in the appointment was 30 minutes* .  All of the patient's questions were answered with apparent satisfaction. The patient knows to call the clinic with any problems, questions or concerns.   Courtney Saran MD MS AAHIVMS [facility] Hematology/Oncology Physician [facility]  .*Total Encounter Time as defined by the Centers for Medicare and Medicaid Services includes, in addition to the face-to-face time of a patient visit (documented in the note above) non-face-to-face time: obtaining and reviewing outside history, ordering and reviewing medications, tests or procedures, care coordination (communications with other health care professionals or caregivers) and documentation in the medical record.    I,Mitra Faeizi,acting as a neurosurgeon for Courtney Saran, MD.,have documented all relevant documentation on the behalf of Courtney Saran, MD,as directed by  Courtney Saran, MD while in the presence of Courtney Saran, MD.  .I have reviewed the above documentation for accuracy and completeness, and I agree with the above. .Kataleah Bejar Kishore Luisfernando Brightwell MD

## 2023-11-21 NOTE — Patient Instructions (Signed)
 [facility] - A DEPT OF [facility]  Discharge Instructions: Thank you for choosing [facility] to provide your oncology and hematology care.   If you have a lab appointment with the Cancer Center, please go directly to the Cancer Center and check in at the registration area.   Wear comfortable clothing and clothing appropriate for easy access to any Portacath or PICC line.   We strive to give you quality time with your provider. You may need to reschedule your appointment if you arrive late (15 or more minutes).  Arriving late affects you and other patients whose appointments are after yours.  Also, if you miss three or more appointments without notifying the office, you may be dismissed from the clinic at the provider's discretion.      For prescription refill requests, have your pharmacy contact our office and allow 72 hours for refills to be completed.    Today you received the following chemotherapy and/or immunotherapy agents :  Rituximab  & Bendamustine       To help prevent nausea and vomiting after your treatment, we encourage you to take your nausea medication as directed.  BELOW ARE SYMPTOMS THAT SHOULD BE REPORTED IMMEDIATELY: *FEVER GREATER THAN 100.4 F (38 C) OR HIGHER *CHILLS OR SWEATING *NAUSEA AND VOMITING THAT IS NOT CONTROLLED WITH YOUR NAUSEA MEDICATION *UNUSUAL SHORTNESS OF BREATH *UNUSUAL BRUISING OR BLEEDING *URINARY PROBLEMS (pain or burning when urinating, or frequent urination) *BOWEL PROBLEMS (unusual diarrhea, constipation, pain near the anus) TENDERNESS IN MOUTH AND THROAT WITH OR WITHOUT PRESENCE OF ULCERS (sore throat, sores in mouth, or a toothache) UNUSUAL RASH, SWELLING OR PAIN  UNUSUAL VAGINAL DISCHARGE OR ITCHING   Items with * indicate a potential emergency and should be followed up as soon as possible or go to the Emergency Department if any problems should occur.  Please show the CHEMOTHERAPY ALERT CARD  or IMMUNOTHERAPY ALERT CARD at check-in to the Emergency Department and triage nurse.  Should you have questions after your visit or need to cancel or reschedule your appointment, please contact [facility]  Dept: [phone]  and follow the prompts.  Office hours are 8:00 a.m. to 4:30 p.m. Monday - Friday. Please note that voicemails left after 4:00 p.m. may not be returned until the following business day.  We are closed weekends and major holidays. You have access to a nurse at all times for urgent questions. Please call the main number to the clinic Dept: [phone] and follow the prompts.   For any non-urgent questions, you may also contact your provider using MyChart. We now offer e-Visits for anyone 79 and older to request care online for non-urgent symptoms. For details visit [email or web address].   Also download the MyChart app! Go to the app store, search MyChart, open the app, select [place], and log in with your MyChart username and password.

## 2023-11-22 ENCOUNTER — Inpatient Hospital Stay: Payer: PPO

## 2023-11-22 VITALS — BP 145/68 | HR 81 | Temp 98.4°F | Resp 17

## 2023-11-22 DIAGNOSIS — Z5111 Encounter for antineoplastic chemotherapy: Secondary | ICD-10-CM | POA: Diagnosis not present

## 2023-11-22 DIAGNOSIS — C8310 Mantle cell lymphoma, unspecified site: Secondary | ICD-10-CM

## 2023-11-22 MED ORDER — SODIUM CHLORIDE 0.9 % IV SOLN
70.0000 mg/m2 | Freq: Once | INTRAVENOUS | Status: AC
  Administered 2023-11-22: 122.5 mg via INTRAVENOUS
  Filled 2023-11-22: qty 4.9

## 2023-11-22 MED ORDER — SODIUM CHLORIDE 0.9 % IV SOLN: INTRAVENOUS | Status: DC

## 2023-11-22 MED ORDER — DEXAMETHASONE SODIUM PHOSPHATE 10 MG/ML IJ SOLN
10.0000 mg | Freq: Once | INTRAMUSCULAR | Status: AC
  Administered 2023-11-22: 10 mg via INTRAVENOUS
  Filled 2023-11-22: qty 1

## 2023-11-22 NOTE — Patient Instructions (Signed)
 [facility]  Discharge Instructions: Thank you for choosing [facility] to provide your oncology and hematology care.   If you have a lab appointment with the Cancer Center, please go directly to the Cancer Center and check in at the registration area.   Wear comfortable clothing and clothing appropriate for easy access to any Portacath or PICC line.   We strive to give you quality time with your provider. You may need to reschedule your appointment if you arrive late (15 or more minutes).  Arriving late affects you and other patients whose appointments are after yours.  Also, if you miss three or more appointments without notifying the office, you may be dismissed from the clinic at the provider's discretion.      For prescription refill requests, have your pharmacy contact our office and allow 72 hours for refills to be completed.    Today you received the following chemotherapy and/or immunotherapy agents: Bendeka      To help prevent nausea and vomiting after your treatment, we encourage you to take your nausea medication as directed.  BELOW ARE SYMPTOMS THAT SHOULD BE REPORTED IMMEDIATELY: *FEVER GREATER THAN 100.4 F (38 C) OR HIGHER *CHILLS OR SWEATING *NAUSEA AND VOMITING THAT IS NOT CONTROLLED WITH YOUR NAUSEA MEDICATION *UNUSUAL SHORTNESS OF BREATH *UNUSUAL BRUISING OR BLEEDING *URINARY PROBLEMS (pain or burning when urinating, or frequent urination) *BOWEL PROBLEMS (unusual diarrhea, constipation, pain near the anus) TENDERNESS IN MOUTH AND THROAT WITH OR WITHOUT PRESENCE OF ULCERS (sore throat, sores in mouth, or a toothache) UNUSUAL RASH, SWELLING OR PAIN  UNUSUAL VAGINAL DISCHARGE OR ITCHING   Items with * indicate a potential emergency and should be followed up as soon as possible or go to the Emergency Department if any problems should occur.  Please show the CHEMOTHERAPY ALERT CARD or IMMUNOTHERAPY  ALERT CARD at check-in to the Emergency Department and triage nurse.  Should you have questions after your visit or need to cancel or reschedule your appointment, please contact [facility]  Dept: [phone]  and follow the prompts.  Office hours are 8:00 a.m. to 4:30 p.m. Monday - Friday. Please note that voicemails left after 4:00 p.m. may not be returned until the following business day.  We are closed weekends and major holidays. You have access to a nurse at all times for urgent questions. Please call the main number to the clinic Dept: [phone] and follow the prompts.   For any non-urgent questions, you may also contact your provider using MyChart. We now offer e-Visits for anyone 73 and older to request care online for non-urgent symptoms. For details visit [email or web address].   Also download the MyChart app! Go to the app store, search "MyChart", open the app, select [place], and log in with your MyChart username and password.

## 2023-11-23 ENCOUNTER — Other Ambulatory Visit: Payer: Self-pay

## 2023-11-27 ENCOUNTER — Encounter: Payer: Self-pay | Admitting: Hematology

## 2023-11-29 ENCOUNTER — Encounter (HOSPITAL_COMMUNITY)
Admission: RE | Admit: 2023-11-29 | Discharge: 2023-11-29 | Disposition: A | Discharge status: Home / Self Care | Payer: PPO | Source: Ambulatory Visit | Attending: Hematology | Admitting: Hematology

## 2023-11-29 DIAGNOSIS — C8310 Mantle cell lymphoma, unspecified site: Secondary | ICD-10-CM | POA: Insufficient documentation

## 2023-11-29 LAB — GLUCOSE, CAPILLARY: Glucose-Capillary: 90 mg/dL (ref 70–99)

## 2023-11-29 MED ORDER — FLUDEOXYGLUCOSE F - 18 (FDG) INJECTION
7.0000 | Freq: Once | INTRAVENOUS | Status: AC
  Administered 2023-11-29: 6.54 via INTRAVENOUS

## 2023-12-19 ENCOUNTER — Inpatient Hospital Stay: Payer: PPO

## 2023-12-19 ENCOUNTER — Inpatient Hospital Stay: Payer: PPO | Admitting: Hematology

## 2023-12-19 ENCOUNTER — Other Ambulatory Visit: Payer: Self-pay

## 2023-12-19 ENCOUNTER — Inpatient Hospital Stay: Payer: PPO | Attending: Hematology

## 2023-12-19 VITALS — BP 145/77 | HR 80 | Temp 97.8°F | Resp 17 | Ht 66.0 in | Wt 144.9 lb

## 2023-12-19 DIAGNOSIS — Z5111 Encounter for antineoplastic chemotherapy: Secondary | ICD-10-CM | POA: Insufficient documentation

## 2023-12-19 DIAGNOSIS — M7989 Other specified soft tissue disorders: Secondary | ICD-10-CM | POA: Diagnosis not present

## 2023-12-19 DIAGNOSIS — C8310 Mantle cell lymphoma, unspecified site: Secondary | ICD-10-CM

## 2023-12-19 DIAGNOSIS — G629 Polyneuropathy, unspecified: Secondary | ICD-10-CM | POA: Diagnosis not present

## 2023-12-19 DIAGNOSIS — Z800 Family history of malignant neoplasm of digestive organs: Secondary | ICD-10-CM | POA: Insufficient documentation

## 2023-12-19 DIAGNOSIS — Z803 Family history of malignant neoplasm of breast: Secondary | ICD-10-CM | POA: Diagnosis not present

## 2023-12-19 DIAGNOSIS — M545 Low back pain, unspecified: Secondary | ICD-10-CM | POA: Diagnosis not present

## 2023-12-19 DIAGNOSIS — M542 Cervicalgia: Secondary | ICD-10-CM | POA: Diagnosis not present

## 2023-12-19 DIAGNOSIS — Z5112 Encounter for antineoplastic immunotherapy: Secondary | ICD-10-CM | POA: Insufficient documentation

## 2023-12-19 DIAGNOSIS — C8313 Mantle cell lymphoma, intra-abdominal lymph nodes: Secondary | ICD-10-CM | POA: Diagnosis not present

## 2023-12-19 DIAGNOSIS — R109 Unspecified abdominal pain: Secondary | ICD-10-CM | POA: Insufficient documentation

## 2023-12-19 LAB — CBC WITH DIFFERENTIAL (CANCER CENTER ONLY)
Abs Immature Granulocytes: 0.01 10*3/uL (ref 0.00–0.07)
Basophils Absolute: 0 10*3/uL (ref 0.0–0.1)
Basophils Relative: 1 %
Eosinophils Absolute: 0.2 10*3/uL (ref 0.0–0.5)
Eosinophils Relative: 5 %
HCT: 38.7 % (ref 36.0–46.0)
Hemoglobin: 12.6 g/dL (ref 12.0–15.0)
Immature Granulocytes: 0 %
Lymphocytes Relative: 7 %
Lymphs Abs: 0.2 10*3/uL — ABNORMAL LOW (ref 0.7–4.0)
MCH: 30.4 pg (ref 26.0–34.0)
MCHC: 32.6 g/dL (ref 30.0–36.0)
MCV: 93.3 fL (ref 80.0–100.0)
Monocytes Absolute: 0.6 10*3/uL (ref 0.1–1.0)
Monocytes Relative: 17 %
Neutro Abs: 2.3 10*3/uL (ref 1.7–7.7)
Neutrophils Relative %: 70 %
Platelet Count: 114 10*3/uL — ABNORMAL LOW (ref 150–400)
RBC: 4.15 MIL/uL (ref 3.87–5.11)
RDW: 14.7 % (ref 11.5–15.5)
WBC Count: 3.2 10*3/uL — ABNORMAL LOW (ref 4.0–10.5)
nRBC: 0 % (ref 0.0–0.2)

## 2023-12-19 LAB — CMP (CANCER CENTER ONLY)
ALT: 13 U/L (ref 0–44)
AST: 21 U/L (ref 15–41)
Albumin: 3.9 g/dL (ref 3.5–5.0)
Alkaline Phosphatase: 61 U/L (ref 38–126)
Anion gap: 6 (ref 5–15)
BUN: 5 mg/dL — ABNORMAL LOW (ref 8–23)
CO2: 30 mmol/L (ref 22–32)
Calcium: 9.4 mg/dL (ref 8.9–10.3)
Chloride: 108 mmol/L (ref 98–111)
Creatinine: 0.74 mg/dL (ref 0.44–1.00)
GFR, Estimated: >60 mL/min (ref >60)
Glucose, Bld: 84 mg/dL (ref 70–99)
Potassium: 3.8 mmol/L (ref 3.5–5.1)
Sodium: 144 mmol/L (ref 135–145)
Total Bilirubin: 0.5 mg/dL (ref 0.0–1.2)
Total Protein: 6.4 g/dL — ABNORMAL LOW (ref 6.5–8.1)

## 2023-12-19 MED ORDER — DIPHENHYDRAMINE HCL 25 MG PO CAPS
50.0000 mg | ORAL_CAPSULE | Freq: Once | ORAL | Status: AC
  Administered 2023-12-19: 50 mg via ORAL
  Filled 2023-12-19: qty 2

## 2023-12-19 MED ORDER — ACETAMINOPHEN 325 MG PO TABS
650.0000 mg | ORAL_TABLET | Freq: Once | ORAL | Status: AC
  Administered 2023-12-19: 650 mg via ORAL
  Filled 2023-12-19: qty 2

## 2023-12-19 MED ORDER — SODIUM CHLORIDE 0.9 % IV SOLN
INTRAVENOUS | Status: DC
Start: 2023-12-19 — End: 2023-12-19

## 2023-12-19 MED ORDER — ACYCLOVIR 400 MG PO TABS: 400.0000 mg | ORAL_TABLET | Freq: Every day | ORAL | 3 refills | Status: DC

## 2023-12-19 MED ORDER — PALONOSETRON HCL INJECTION 0.25 MG/5ML
0.2500 mg | Freq: Once | INTRAVENOUS | Status: AC
  Administered 2023-12-19: 0.25 mg via INTRAVENOUS
  Filled 2023-12-19: qty 5

## 2023-12-19 MED ORDER — SODIUM CHLORIDE 0.9 % IV SOLN
70.0000 mg/m2 | Freq: Once | INTRAVENOUS | Status: AC
  Administered 2023-12-19: 122.5 mg via INTRAVENOUS
  Filled 2023-12-19: qty 4.9

## 2023-12-19 MED ORDER — RITUXIMAB-PVVR CHEMO 500 MG/50ML IV SOLN
375.0000 mg/m2 | Freq: Once | INTRAVENOUS | Status: AC
  Administered 2023-12-19: 700 mg via INTRAVENOUS
  Filled 2023-12-19: qty 20

## 2023-12-19 MED ORDER — DEXAMETHASONE SODIUM PHOSPHATE 10 MG/ML IJ SOLN
10.0000 mg | Freq: Once | INTRAMUSCULAR | Status: AC
  Administered 2023-12-19: 10 mg via INTRAVENOUS
  Filled 2023-12-19: qty 1

## 2023-12-19 NOTE — Patient Instructions (Signed)
 [facility] - A DEPT OF [facility]  Discharge Instructions: Thank you for choosing [facility] to provide your oncology and hematology care.   If you have a lab appointment with the Cancer Center, please go directly to the Cancer Center and check in at the registration area.   Wear comfortable clothing and clothing appropriate for easy access to any Portacath or PICC line.   We strive to give you quality time with your provider. You may need to reschedule your appointment if you arrive late (15 or more minutes).  Arriving late affects you and other patients whose appointments are after yours.  Also, if you miss three or more appointments without notifying the office, you may be dismissed from the clinic at the provider's discretion.      For prescription refill requests, have your pharmacy contact our office and allow 72 hours for refills to be completed.    Today you received the following chemotherapy and/or immunotherapy agents: Bendeka , Rituxan     To help prevent nausea and vomiting after your treatment, we encourage you to take your nausea medication as directed.  BELOW ARE SYMPTOMS THAT SHOULD BE REPORTED IMMEDIATELY: *FEVER GREATER THAN 100.4 F (38 C) OR HIGHER *CHILLS OR SWEATING *NAUSEA AND VOMITING THAT IS NOT CONTROLLED WITH YOUR NAUSEA MEDICATION *UNUSUAL SHORTNESS OF BREATH *UNUSUAL BRUISING OR BLEEDING *URINARY PROBLEMS (pain or burning when urinating, or frequent urination) *BOWEL PROBLEMS (unusual diarrhea, constipation, pain near the anus) TENDERNESS IN MOUTH AND THROAT WITH OR WITHOUT PRESENCE OF ULCERS (sore throat, sores in mouth, or a toothache) UNUSUAL RASH, SWELLING OR PAIN  UNUSUAL VAGINAL DISCHARGE OR ITCHING   Items with * indicate a potential emergency and should be followed up as soon as possible or go to the Emergency Department if any problems should occur.  Please show the CHEMOTHERAPY ALERT CARD or  IMMUNOTHERAPY ALERT CARD at check-in to the Emergency Department and triage nurse.  Should you have questions after your visit or need to cancel or reschedule your appointment, please contact [facility]  Dept: [phone]  and follow the prompts.  Office hours are 8:00 a.m. to 4:30 p.m. Monday - Friday. Please note that voicemails left after 4:00 p.m. may not be returned until the following business day.  We are closed weekends and major holidays. You have access to a nurse at all times for urgent questions. Please call the main number to the clinic Dept: [phone] and follow the prompts.   For any non-urgent questions, you may also contact your provider using MyChart. We now offer e-Visits for anyone 92 and older to request care online for non-urgent symptoms. For details visit [email or web address].   Also download the MyChart app! Go to the app store, search MyChart, open the app, select [place], and log in with your MyChart username and password.

## 2023-12-19 NOTE — Progress Notes (Signed)
 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   HISTORY OF PRESENTING ILLNESS:   SHAKENA CALLARI is a wonderful 78 y.o. female who has been referred to us  by Elicia Claw, MD for evaluation and management of Mantle cell lymphoma.   Patient reports having a history of IBS for several years with no recent symptoms. Her bowel symptoms, including diarrhea and abdominal cramping, have been stable and managed with Hyoscyamine.    She reports stable lack of appetite over the last few years. She notes that her weight fluctuates but denies any significant weight loss.   She denies any history of polyps in the past prior to her recent routine colonoscopy with Dr. Elicia which showed multiple polyps which were noted to be tubular adenomas, however the mass in sigmoid colon which was reported to look like a lipoma on colonoscopy on biopsy turned out to show a mantle cell lymphoma.  Her previous colonoscopy was likely about 5-6 years ago, though she admits her timeline may be inaccurate.  Patient has stable bilateral leg swelling and occasional lower back pain. She denies any sudden weight loss, fever, chills, night sweats, new lumps/bumps, or swallowing issues.   She reports occasional episodes of feeling flushed with itchiness and redness in her face/head. These episodes have been occurring since a year ago. She does not tend to get acid reflux.  Patient denies any heart, lung, liver, or kidney issues. Patient is able to stay active with no significant physical limitations. She does note having neuropathy in her bilateral lower extremities, which she controls with Tramadol and Gabapentin , managed by neurology.  Patient is a never smoker and denies any alcohol use.  In regards to her fhx,  she reports that her sister has breast and colon cancer. Also, her mother has a history of brain tumor, her father has CHF, her brother has throat cancer, and her maternal uncle was diagnosed with mouth cancer and was a frequent smoker. She notes that her brother was a smoker, but likely did not have heavy use.   INTERVAL HISTORY:  JI FAIRBURN is a 78 y.o. female here for continued evaluation and management of mantle cell lymphoma. She was last seen by me on 11/21/2023 and complained of mouth sores, mild fatigue, low p.o. intake, a short-lived episode of leg pain, and occasional nausea.   Today, she returns for toxicity check prior to cycle 5 day 1 of treatment. Patient complains of mild fatigue.   She complains of right neck pain. Patient inquires whether it may be related to unusual position. She denies any concern for pinched nerves in the back and denies back pain.   She complains of intermittent burning and stinging in her legs present for several years. These symptoms are not present in the feet. Patient notes that fanning her legs did improve symptoms. She reports that when taking a shower, she feels like having a near-fall event due to leg weakness.   Patient was previously seen by a neurologist and received a nerve conduction study. She notes having multiple areas of her left lower extremity biopsied. She was found to have nerve issues and was started on gabapentin  and tramadol.   She is currently taking one tablet of gabapentin  in the morning, one tablet at 6 pm, and two tablets at night. Gabapentin  controls  her symptoms fairly well.   She reports that she forgets to take B-complex vitamin supplementation. Patient reports that she has not taken allopurinol  for a while.   Patient reports that she stays regularly hydrated. She denies any weight loss and her current weight is 144 pounds.   She reports that last week, she endorsed significant stomach pain and notes having 2-3 bowel  movements. Patient continues to take Levsin for her irritable bowel issues, and notes that she sometimes takes it after meals. She has tried taking an anti-diarrhea tablet. Patient has been hesitant to try a stool softener for a while due to her bowel issues. Her stomach issues have improved this week.   She reports that usually has more elevated blood pressure readings while in clinical settings, which typically settles down upon recheck.   MEDICAL HISTORY:  Past Medical History:  Diagnosis Date   Anxiety    Carpal tunnel syndrome    Depression    Hypercholesterolemia    IBS (irritable bowel syndrome)    Insomnia    Sensory disease or syndrome 05/05/2014    SURGICAL HISTORY: Past Surgical History:  Procedure Laterality Date   CARPAL TUNNEL RELEASE Right    TUBAL LIGATION  1974    SOCIAL HISTORY: Social History   Socioeconomic History   Marital status: Widowed    Spouse name: Not on file   Number of children: 2   Years of education: 44   Highest education level: Not on file  Occupational History   Occupation: [employer]  Tobacco Use   Smoking status: Never   Smokeless tobacco: Never  Substance and Sexual Activity   Alcohol use: No    Alcohol/week: 0.0 standard drinks of alcohol   Drug use: No   Sexual activity: Not on file  Other Topics Concern   Not on file  Social History Narrative   Patient is widowed and her daughter and grandson lives with her.   Patient does drink one glass of tea daily.   Patient works on an  theatre stage manager.   Patient has a high school education.   Patient has two adult children.   Patient is right-handed.                  Social Drivers of Corporate Investment Banker Strain: Not on file  Food Insecurity: Not on file  Transportation Needs: Not on file  Physical Activity: Not on file  Stress: Not on file  Social Connections: Not on file  Intimate Partner Violence: Not on file    FAMILY HISTORY: Family History  Problem Relation  Age of Onset   Breast cancer Sister        lymph nodes   Parkinson's disease Brother    Throat cancer Brother    Macular degeneration Brother     ALLERGIES:  has no known allergies.  MEDICATIONS:  Current Outpatient Medications  Medication Sig Dispense Refill   acyclovir  (ZOVIRAX ) 400 MG tablet Take 1 tablet (400 mg total) by mouth daily. 30 tablet 3   allopurinol  (ZYLOPRIM ) 300 MG tablet TAKE 1/2 TABLET(150 MG) BY MOUTH DAILY (Patient not taking: Reported on 10/26/2023) 30 tablet 0   calcium gluconate 650 MG tablet Take 650 mg by mouth daily.     cholecalciferol (VITAMIN D ) 1000 UNITS tablet Take 1,000 Units by mouth daily.     dexamethasone  (DECADRON ) 4 MG tablet Take 2 tablets (8 mg total) by mouth daily. Start the day after bendamustine  chemotherapy for 2 days.  Take with food. 30 tablet 1   gabapentin  (NEURONTIN ) 400 MG capsule TAKE ONE CAPSULE BY MOUTH IN THE MORNING AND TAKE 1 CAPSULE IN EVENING AND TAKE 2 CAPSULES AT BEDTIME 120 capsule 0   hyoscyamine (LEVSIN, ANASPAZ) 0.125 MG tablet Take 0.125 mg by mouth every 4 (four) hours as needed (abdominal pain).      ondansetron  (ZOFRAN ) 8 MG tablet Take 1 tablet (8 mg total) by mouth every 8 (eight) hours as needed for nausea or vomiting. Start on the third day after chemotherapy. 30 tablet 1   Prenatal Vit-Fe Fumarate-FA (PRENATAL VITAMINS) 28-0.8 MG TABS Take 28 mg by mouth 1 day or 1 dose. (Patient taking differently: Take 28 mg by mouth daily.) 30 tablet 5   prochlorperazine  (COMPAZINE ) 10 MG tablet Take 1 tablet (10 mg total) by mouth every 6 (six) hours as needed for nausea or vomiting. 30 tablet 1   temazepam (RESTORIL) 15 MG capsule Take 15 mg by mouth at bedtime as needed for sleep.     traMADol (ULTRAM) 50 MG tablet Take 50 mg by mouth 2 (two) times daily. 50mg  in am and 50mg  in pm     No current facility-administered medications for this visit.   Facility-Administered Medications Ordered in Other Visits  Medication Dose  Route Frequency Provider Last Rate Last Admin   0.9 %  sodium chloride  infusion   Intravenous Continuous Jawuan Robb Kishore, MD 200 mL/hr at 11/21/23 1631 Infusion Verify at 11/21/23 1631    REVIEW OF SYSTEMS:    10 Point review of Systems was done is negative except as noted above.   PHYSICAL EXAMINATION: ECOG PERFORMANCE STATUS: 1 - Symptomatic but completely ambulatory  . Vitals:   12/19/23 0953  BP: (!) 145/77  Pulse: 80  Resp: 17  Temp: 97.8 F (36.6 C)  SpO2: 98%   Filed Weights   12/19/23 0953  Weight: 144 lb 14.4 oz (65.7 kg)    .Body mass index is 23.39 kg/m. GENERAL:alert, in no acute distress and comfortable SKIN: no acute rashes, no significant lesions EYES: conjunctiva are pink and non-injected, sclera anicteric OROPHARYNX: MMM, no exudates, no oropharyngeal erythema or ulceration NECK: supple, no JVD LYMPH:  no palpable lymphadenopathy in the cervical, axillary or inguinal regions LUNGS: clear to auscultation b/l with normal respiratory effort HEART: regular rate & rhythm ABDOMEN:  normoactive bowel sounds , non tender, not distended. Extremity: no pedal edema PSYCH: alert & oriented x 3 with fluent speech NEURO: no focal motor/sensory deficits   LABORATORY DATA:  I have reviewed the data as listed  .    Latest Ref Rng & Units 12/19/2023    9:35 AM 11/21/2023    9:40 AM 10/25/2023   12:13 PM  CBC  WBC 4.0 - 10.5 K/uL 3.2  2.9  4.2   Hemoglobin 12.0 - 15.0 g/dL 87.3  86.7  87.3   Hematocrit 36.0 - 46.0 % 38.7  39.1  37.5   Platelets 150 - 400 K/uL 114  131  101     .    Latest Ref Rng & Units 12/19/2023    9:35 AM 11/21/2023    9:40 AM 10/25/2023   12:13 PM  CMP  Glucose 70 - 99 mg/dL 84  874  853   BUN 8 - 23 mg/dL 5  8  9    Creatinine 0.44 - 1.00 mg/dL 9.25  9.24  9.36   Sodium 135 - 145 mmol/L 144  144  142   Potassium 3.5 -  5.1 mmol/L 3.8  3.9  4.0   Chloride 98 - 111 mmol/L 108  109  107   CO2 22 - 32 mmol/L 30  28  31    Calcium 8.9 -  10.3 mg/dL 9.4  9.2  9.0   Total Protein 6.5 - 8.1 g/dL 6.4  6.5  6.5   Total Bilirubin 0.0 - 1.2 mg/dL 0.5  0.4  0.3   Alkaline Phos 38 - 126 U/L 61  64  63   AST 15 - 41 U/L 21  25  23    ALT 0 - 44 U/L 13  15  16       RADIOGRAPHIC STUDIES: I have personally reviewed the radiological images as listed and agreed with the findings in the report. NM PET Image Restag (PS) Skull Base To Thigh Result Date: 12/07/2023 CLINICAL DATA:  Subsequent treatment strategy for mantle cell lymphoma. EXAM: NUCLEAR MEDICINE PET SKULL BASE TO THIGH TECHNIQUE: 6.5 mCi F-18 FDG was injected intravenously. Full-ring PET imaging was performed from the skull base to thigh after the radiotracer. CT data was obtained and used for attenuation correction and anatomic localization. Fasting blood glucose: 90 mg/dl COMPARISON:  90/96/7975. FINDINGS: Mediastinal blood pool activity: SUV max 2.2 Liver activity: SUV max 2.7 NECK: No abnormal hypermetabolism. Incidental CT findings: None. CHEST: No abnormal hypermetabolism. Incidental CT findings: Atherosclerotic calcification of the aorta. Heart is enlarged. No pericardial or pleural effusion. ABDOMEN/PELVIS: No abnormal hypermetabolism. Incidental CT findings: Small low-attenuation lesions in the left kidney. No specific follow-up necessary. Atherosclerotic calcification of the aorta. SKELETON: No abnormal hypermetabolism. Incidental CT findings: Degenerative changes in the spine. IMPRESSION: 1. No evidence of metabolically active lymphoma ([place] 1). 2.  Aortic atherosclerosis (ICD10-I70.0). Electronically Signed   By: Newell Eke M.D.   On: 12/07/2023 14:01    ASSESSMENT & PLAN:   78 y.o. female with:  Mantle cell lymphoma- newly diagnosed. Found as mass in the sigmoid colon on colonoscopy done to evaluate for Fhx of colon cancer and chronic IBS like symptoms. IBS Fhx of breast and colon cancer.  Hx of depression/Anxiety H/o Idiopathic neuropathy- on neurontin  and prn  tramadol  PLAN:  -Discussed lab results on 12/19/23 in detail with patient. CBC showed WBC of 3.2K, hemoglobin of 12.6, and platelets of 114K. -CMP normal -PET scan from 11/29/2023 shows no evidence of metabolically active lymphoma  -reviewed her prevous 07/17/2023 PET scan findings with patient to discuss comparisons -patient reports mild toxicity of fatigue, but denies any major toxicity from BR treatment -patient is appropriate to proceed with cycle 5 day 1 of Bendamustine  and Rituxan  treatment today with same supportive medications -continue antibody infusion treatment every 2 months -continue to take acyclovir  while she continues to receive chemotherapy and for one month afterwards -take vitamin B complex regularly -take Vitamin D  regularly -stop allopurinol  -continue levsin as needed -educated patient that gabapentin  only works if it is used in a continuous fashion as prescribed -discussed option of using compression socks to improve her neuropathy -discussed option of using a shower chair to prevent falls -educated patient that her medications generally would not cause neuropathy -her right neck pain is likely due to muscle strain -answered all of patient's questions in detail -she shall return to clinic in 1 month with labs  FOLLOW-UP: F/u per scheduled appointments for C6 of BR with labs and MD visit on C6D1.  The total time spent in the appointment was 32 minutes* .  All of the patient's questions were answered with  apparent satisfaction. The patient knows to call the clinic with any problems, questions or concerns.   Emaline Saran MD MS AAHIVMS [facility] [place] [place] Hematology/Oncology Physician [facility]  .*Total Encounter Time as defined by the Centers for Medicare and Medicaid Services includes, in addition to the face-to-face time of a patient visit (documented in the note above) non-face-to-face time: obtaining and reviewing outside history, ordering and reviewing  medications, tests or procedures, care coordination (communications with other health care professionals or caregivers) and documentation in the medical record.    I,Mitra Faeizi,acting as a neurosurgeon for Emaline Saran, MD.,have documented all relevant documentation on the behalf of Emaline Saran, MD,as directed by  Emaline Saran, MD while in the presence of Emaline Saran, MD.  .I have reviewed the above documentation for accuracy and completeness, and I agree with the above. .Roshni Burbano Kishore Jaiveer Panas MD

## 2023-12-19 NOTE — Progress Notes (Signed)
 Patient seen by Dr. Onesimo Moccasin are within treatment parameters.  Labs reviewed: and are not all within treatment parameters.    Dr Onesimo aware Plts: 114,   Per physician team, patient is ready for treatment and there are NO modifications to the treatment plan.

## 2023-12-20 ENCOUNTER — Inpatient Hospital Stay: Payer: PPO

## 2023-12-20 VITALS — BP 138/68 | HR 91 | Temp 98.0°F | Resp 16

## 2023-12-20 DIAGNOSIS — C8310 Mantle cell lymphoma, unspecified site: Secondary | ICD-10-CM

## 2023-12-20 DIAGNOSIS — Z5111 Encounter for antineoplastic chemotherapy: Secondary | ICD-10-CM | POA: Diagnosis not present

## 2023-12-20 MED ORDER — SODIUM CHLORIDE 0.9 % IV SOLN
70.0000 mg/m2 | Freq: Once | INTRAVENOUS | Status: AC
  Administered 2023-12-20: 122.5 mg via INTRAVENOUS
  Filled 2023-12-20: qty 4.9

## 2023-12-20 MED ORDER — SODIUM CHLORIDE 0.9 % IV SOLN: INTRAVENOUS | Status: DC

## 2023-12-20 MED ORDER — DEXAMETHASONE SODIUM PHOSPHATE 10 MG/ML IJ SOLN
10.0000 mg | Freq: Once | INTRAMUSCULAR | Status: AC
  Administered 2023-12-20: 10 mg via INTRAVENOUS
  Filled 2023-12-20: qty 1

## 2023-12-20 NOTE — Patient Instructions (Signed)
 [facility]  Discharge Instructions: Thank you for choosing [facility] to provide your oncology and hematology care.   If you have a lab appointment with the Cancer Center, please go directly to the Cancer Center and check in at the registration area.   Wear comfortable clothing and clothing appropriate for easy access to any Portacath or PICC line.   We strive to give you quality time with your provider. You may need to reschedule your appointment if you arrive late (15 or more minutes).  Arriving late affects you and other patients whose appointments are after yours.  Also, if you miss three or more appointments without notifying the office, you may be dismissed from the clinic at the provider's discretion.      For prescription refill requests, have your pharmacy contact our office and allow 72 hours for refills to be completed.    Today you received the following chemotherapy and/or immunotherapy agents: Bendeka      To help prevent nausea and vomiting after your treatment, we encourage you to take your nausea medication as directed.  BELOW ARE SYMPTOMS THAT SHOULD BE REPORTED IMMEDIATELY: *FEVER GREATER THAN 100.4 F (38 C) OR HIGHER *CHILLS OR SWEATING *NAUSEA AND VOMITING THAT IS NOT CONTROLLED WITH YOUR NAUSEA MEDICATION *UNUSUAL SHORTNESS OF BREATH *UNUSUAL BRUISING OR BLEEDING *URINARY PROBLEMS (pain or burning when urinating, or frequent urination) *BOWEL PROBLEMS (unusual diarrhea, constipation, pain near the anus) TENDERNESS IN MOUTH AND THROAT WITH OR WITHOUT PRESENCE OF ULCERS (sore throat, sores in mouth, or a toothache) UNUSUAL RASH, SWELLING OR PAIN  UNUSUAL VAGINAL DISCHARGE OR ITCHING   Items with * indicate a potential emergency and should be followed up as soon as possible or go to the Emergency Department if any problems should occur.  Please show the CHEMOTHERAPY ALERT CARD or IMMUNOTHERAPY  ALERT CARD at check-in to the Emergency Department and triage nurse.  Should you have questions after your visit or need to cancel or reschedule your appointment, please contact [facility]  Dept: [phone]  and follow the prompts.  Office hours are 8:00 a.m. to 4:30 p.m. Monday - Friday. Please note that voicemails left after 4:00 p.m. may not be returned until the following business day.  We are closed weekends and major holidays. You have access to a nurse at all times for urgent questions. Please call the main number to the clinic Dept: [phone] and follow the prompts.   For any non-urgent questions, you may also contact your provider using MyChart. We now offer e-Visits for anyone 73 and older to request care online for non-urgent symptoms. For details visit [email or web address].   Also download the MyChart app! Go to the app store, search "MyChart", open the app, select [place], and log in with your MyChart username and password.

## 2023-12-26 ENCOUNTER — Encounter: Payer: Self-pay | Admitting: Hematology

## 2024-01-16 ENCOUNTER — Inpatient Hospital Stay: Payer: PPO

## 2024-01-16 ENCOUNTER — Inpatient Hospital Stay: Payer: PPO | Admitting: Hematology

## 2024-01-16 ENCOUNTER — Inpatient Hospital Stay: Payer: PPO | Attending: Hematology

## 2024-01-16 VITALS — BP 160/87 | HR 70 | Resp 17

## 2024-01-16 VITALS — BP 168/73 | HR 89 | Temp 98.2°F | Resp 18 | Ht 66.0 in | Wt 142.2 lb

## 2024-01-16 DIAGNOSIS — Z800 Family history of malignant neoplasm of digestive organs: Secondary | ICD-10-CM | POA: Diagnosis not present

## 2024-01-16 DIAGNOSIS — Z5111 Encounter for antineoplastic chemotherapy: Secondary | ICD-10-CM

## 2024-01-16 DIAGNOSIS — M545 Low back pain, unspecified: Secondary | ICD-10-CM | POA: Diagnosis not present

## 2024-01-16 DIAGNOSIS — C8313 Mantle cell lymphoma, intra-abdominal lymph nodes: Secondary | ICD-10-CM | POA: Insufficient documentation

## 2024-01-16 DIAGNOSIS — Z803 Family history of malignant neoplasm of breast: Secondary | ICD-10-CM | POA: Insufficient documentation

## 2024-01-16 DIAGNOSIS — Z5112 Encounter for antineoplastic immunotherapy: Secondary | ICD-10-CM | POA: Insufficient documentation

## 2024-01-16 DIAGNOSIS — G609 Hereditary and idiopathic neuropathy, unspecified: Secondary | ICD-10-CM | POA: Diagnosis not present

## 2024-01-16 DIAGNOSIS — M7989 Other specified soft tissue disorders: Secondary | ICD-10-CM | POA: Diagnosis not present

## 2024-01-16 DIAGNOSIS — C8310 Mantle cell lymphoma, unspecified site: Secondary | ICD-10-CM

## 2024-01-16 LAB — CBC WITH DIFFERENTIAL (CANCER CENTER ONLY)
Abs Immature Granulocytes: 0.02 10*3/uL (ref 0.00–0.07)
Basophils Absolute: 0 10*3/uL (ref 0.0–0.1)
Basophils Relative: 2 %
Eosinophils Absolute: 0.1 10*3/uL (ref 0.0–0.5)
Eosinophils Relative: 4 %
HCT: 39.4 % (ref 36.0–46.0)
Hemoglobin: 13 g/dL (ref 12.0–15.0)
Immature Granulocytes: 1 %
Lymphocytes Relative: 6 %
Lymphs Abs: 0.2 10*3/uL — ABNORMAL LOW (ref 0.7–4.0)
MCH: 30.7 pg (ref 26.0–34.0)
MCHC: 33 g/dL (ref 30.0–36.0)
MCV: 92.9 fL (ref 80.0–100.0)
Monocytes Absolute: 0.4 10*3/uL (ref 0.1–1.0)
Monocytes Relative: 14 %
Neutro Abs: 1.9 10*3/uL (ref 1.7–7.7)
Neutrophils Relative %: 73 %
Platelet Count: 125 10*3/uL — ABNORMAL LOW (ref 150–400)
RBC: 4.24 MIL/uL (ref 3.87–5.11)
RDW: 14.6 % (ref 11.5–15.5)
WBC Count: 2.6 10*3/uL — ABNORMAL LOW (ref 4.0–10.5)
nRBC: 0 % (ref 0.0–0.2)

## 2024-01-16 LAB — CMP (CANCER CENTER ONLY)
ALT: 16 U/L (ref 0–44)
AST: 22 U/L (ref 15–41)
Albumin: 3.9 g/dL (ref 3.5–5.0)
Alkaline Phosphatase: 62 U/L (ref 38–126)
Anion gap: 6 (ref 5–15)
BUN: 7 mg/dL — ABNORMAL LOW (ref 8–23)
CO2: 30 mmol/L (ref 22–32)
Calcium: 8.8 mg/dL — ABNORMAL LOW (ref 8.9–10.3)
Chloride: 108 mmol/L (ref 98–111)
Creatinine: 0.72 mg/dL (ref 0.44–1.00)
GFR, Estimated: >60 mL/min (ref >60)
Glucose, Bld: 125 mg/dL — ABNORMAL HIGH (ref 70–99)
Potassium: 3.3 mmol/L — ABNORMAL LOW (ref 3.5–5.1)
Sodium: 144 mmol/L (ref 135–145)
Total Bilirubin: 0.4 mg/dL (ref 0.0–1.2)
Total Protein: 6.4 g/dL — ABNORMAL LOW (ref 6.5–8.1)

## 2024-01-16 MED ORDER — SODIUM CHLORIDE 0.9 % IV SOLN
375.0000 mg/m2 | Freq: Once | INTRAVENOUS | Status: AC
  Administered 2024-01-16: 700 mg via INTRAVENOUS
  Filled 2024-01-16: qty 50

## 2024-01-16 MED ORDER — ACETAMINOPHEN 325 MG PO TABS
650.0000 mg | ORAL_TABLET | Freq: Once | ORAL | Status: AC
  Administered 2024-01-16: 650 mg via ORAL
  Filled 2024-01-16: qty 2

## 2024-01-16 MED ORDER — SODIUM CHLORIDE 0.9 % IV SOLN: INTRAVENOUS | Status: DC

## 2024-01-16 MED ORDER — DEXAMETHASONE SODIUM PHOSPHATE 10 MG/ML IJ SOLN
10.0000 mg | Freq: Once | INTRAMUSCULAR | Status: AC
  Administered 2024-01-16: 10 mg via INTRAVENOUS
  Filled 2024-01-16: qty 1

## 2024-01-16 MED ORDER — DIPHENHYDRAMINE HCL 25 MG PO CAPS
25.0000 mg | ORAL_CAPSULE | Freq: Once | ORAL | Status: AC
  Administered 2024-01-16: 25 mg via ORAL
  Filled 2024-01-16: qty 1

## 2024-01-16 NOTE — Progress Notes (Signed)
 HEMATOLOGY/ONCOLOGY CLINIC NOTE  Date of Service: 01/16/24  Patient Care Team: Irven Coe, MD as PCP - General (Family Medicine)  CHIEF COMPLAINTS/PURPOSE OF CONSULTATION:  Evaluation and management of recently diagnosed mantle cell lymphoma.   HISTORY OF PRESENTING ILLNESS:   Courtney House is a wonderful 78 y.o. female who has been referred to [place] by Kathi Der, MD for evaluation and management of Mantle cell lymphoma.   Patient reports having a history of IBS for several years with no recent symptoms. Her bowel symptoms, including diarrhea and abdominal cramping, have been stable and managed with Hyoscyamine.    She reports stable lack of appetite over the last few years. She notes that her weight fluctuates but denies any significant weight loss.   She denies any history of polyps in the past prior to her recent routine colonoscopy with Dr. Levora Angel which showed multiple polyps which were noted to be tubular adenomas, however the mass in sigmoid colon which was reported to look like a "lipoma" on colonoscopy on biopsy turned out to show a mantle cell lymphoma.  Her previous colonoscopy was likely about 5-6 years ago, though she admits her timeline may be inaccurate.  Patient has stable bilateral leg swelling and occasional lower back pain. She denies any sudden weight loss, fever, chills, night sweats, new lumps/bumps, or swallowing issues.   She reports occasional episodes of feeling flushed with itchiness and redness in her face/head. These episodes have been occurring since a year ago. She does not tend to get acid reflux.  Patient denies any heart, lung, liver, or kidney issues. Patient is able to stay active with no significant physical limitations. She does note having neuropathy in her bilateral lower extremities, which she controls with Tramadol and Gabapentin, managed by neurology.  Patient is a never smoker and denies any alcohol use.  In regards to her fhx,  she reports that her sister has breast and colon cancer. Also, her mother has a history of brain tumor, her father has CHF, her brother has throat cancer, and her maternal uncle was diagnosed with mouth cancer and was a frequent smoker. She notes that her brother was a smoker, but likely did not have heavy use.   INTERVAL HISTORY:  Courtney House is a 78 y.o. female here for continued evaluation and management of mantle cell lymphoma. She was last seen by me on 12/19/2023 and complained of mild fatigue, right neck pain, intermittent burning and stinging in her legs, leg weakness, and stomach pain.  Today, she presents for toxicity check prior to cycle 6 of her treatment. Patient reports increased fatigue though it has improved today.  Patient reports that she tries to maintain optimal p.o. intake, though she is limited by lack of taste and appetite. She generally stays well-hydrated.  She is taking vitamin B complex once daily.   Patient reports that immediately after her last infusion treatment, after sitting for 4 hours with her feet elevated, she was unable to walk. She attributes her walking issues to feeling in a daze from her premeds/steroids. Her walking did eventually improve. She did not have these issues with previous infusion treatment.   Patient reports mild feet swelling, which has improved over the last week.   She denies any infections issues. Patient reports intermittent sleep disturbances.   She reports mild nausea once in a while and does take nausea medication as needed. Patient reports some diarrhea issues over the weekend.   MEDICAL HISTORY:  Past Medical  History:  Diagnosis Date   Anxiety    Carpal tunnel syndrome    Depression    Hypercholesterolemia    IBS (irritable bowel syndrome)    Insomnia    Sensory disease or syndrome 05/05/2014    SURGICAL HISTORY: Past Surgical History:  Procedure Laterality Date   CARPAL TUNNEL RELEASE Right    TUBAL LIGATION  1974     SOCIAL HISTORY: Social History   Socioeconomic History   Marital status: Widowed    Spouse name: Not on file   Number of children: 2   Years of education: 35   Highest education level: Not on file  Occupational History   Occupation: [employer]  Tobacco Use   Smoking status: Never   Smokeless tobacco: Never  Substance and Sexual Activity   Alcohol use: No    Alcohol/week: 0.0 standard drinks of alcohol   Drug use: No   Sexual activity: Not on file  Other Topics Concern   Not on file  Social History Narrative   Patient is widowed and her daughter and grandson lives with her.   Patient does drink one glass of tea daily.   Patient works on an  Theatre stage manager.   Patient has a high school education.   Patient has two adult children.   Patient is right-handed.                  Social Drivers of Corporate investment banker Strain: Not on file  Food Insecurity: Not on file  Transportation Needs: Not on file  Physical Activity: Not on file  Stress: Not on file  Social Connections: Not on file  Intimate Partner Violence: Not on file    FAMILY HISTORY: Family History  Problem Relation Age of Onset   Breast cancer Sister        lymph nodes   Parkinson's disease Brother    Throat cancer Brother    Macular degeneration Brother     ALLERGIES:  has no known allergies.  MEDICATIONS:  Current Outpatient Medications  Medication Sig Dispense Refill   acyclovir (ZOVIRAX) 400 MG tablet Take 1 tablet (400 mg total) by mouth daily. 30 tablet 3   allopurinol (ZYLOPRIM) 300 MG tablet TAKE 1/2 TABLET(150 MG) BY MOUTH DAILY (Patient not taking: Reported on 12/19/2023) 30 tablet 0   calcium gluconate 650 MG tablet Take 650 mg by mouth daily.     cholecalciferol (VITAMIN D) 1000 UNITS tablet Take 1,000 Units by mouth daily.     dexamethasone (DECADRON) 4 MG tablet Take 2 tablets (8 mg total) by mouth daily. Start the day after bendamustine chemotherapy for 2 days. Take with food. 30  tablet 1   gabapentin (NEURONTIN) 400 MG capsule TAKE ONE CAPSULE BY MOUTH IN THE MORNING AND TAKE 1 CAPSULE IN EVENING AND TAKE 2 CAPSULES AT BEDTIME 120 capsule 0   hyoscyamine (LEVSIN, ANASPAZ) 0.125 MG tablet Take 0.125 mg by mouth every 4 (four) hours as needed (abdominal pain).      ondansetron (ZOFRAN) 8 MG tablet Take 1 tablet (8 mg total) by mouth every 8 (eight) hours as needed for nausea or vomiting. Start on the third day after chemotherapy. 30 tablet 1   Prenatal Vit-Fe Fumarate-FA (PRENATAL VITAMINS) 28-0.8 MG TABS Take 28 mg by mouth 1 day or 1 dose. (Patient taking differently: Take 28 mg by mouth daily.) 30 tablet 5   prochlorperazine (COMPAZINE) 10 MG tablet Take 1 tablet (10 mg total) by mouth every 6 (six)  hours as needed for nausea or vomiting. 30 tablet 1   temazepam (RESTORIL) 15 MG capsule Take 15 mg by mouth at bedtime as needed for sleep.     traMADol (ULTRAM) 50 MG tablet Take 50 mg by mouth 2 (two) times daily. 50mg  in am and 50mg  in pm     No current facility-administered medications for this visit.   Facility-Administered Medications Ordered in Other Visits  Medication Dose Route Frequency Provider Last Rate Last Admin   0.9 %  sodium chloride infusion   Intravenous Continuous Johney Maine, MD 200 mL/hr at 11/21/23 1631 Infusion Verify at 11/21/23 1631    REVIEW OF SYSTEMS:    10 Point review of Systems was done is negative except as noted above.   PHYSICAL EXAMINATION: ECOG PERFORMANCE STATUS: 1 - Symptomatic but completely ambulatory  . Vitals:   01/16/24 1007  BP: (!) 168/73  Pulse: 89  Resp: 18  Temp: 98.2 F (36.8 C)  SpO2: 98%    Filed Weights   01/16/24 1007  Weight: 142 lb 3.2 oz (64.5 kg)   .Body mass index is 22.95 kg/m. GENERAL:alert, in no acute distress and comfortable SKIN: no acute rashes, no significant lesions EYES: conjunctiva are pink and non-injected, sclera anicteric OROPHARYNX: MMM, no exudates, no oropharyngeal  erythema or ulceration NECK: supple, no JVD LYMPH:  no palpable lymphadenopathy in the cervical, axillary or inguinal regions LUNGS: clear to auscultation b/l with normal respiratory effort HEART: regular rate & rhythm ABDOMEN:  normoactive bowel sounds , non tender, not distended. Extremity: no pedal edema PSYCH: alert & oriented x 3 with fluent speech NEURO: no focal motor/sensory deficits   LABORATORY DATA:  I have reviewed the data as listed  .    Latest Ref Rng & Units 01/16/2024    9:39 AM 12/19/2023    9:35 AM 11/21/2023    9:40 AM  CBC  WBC 4.0 - 10.5 K/uL 2.6  3.2  2.9   Hemoglobin 12.0 - 15.0 g/dL 29.5  62.1  30.8   Hematocrit 36.0 - 46.0 % 39.4  38.7  39.1   Platelets 150 - 400 K/uL 125  114  131     .    Latest Ref Rng & Units 01/16/2024    9:39 AM 12/19/2023    9:35 AM 11/21/2023    9:40 AM  CMP  Glucose 70 - 99 mg/dL 657  84  846   BUN 8 - 23 mg/dL 7  5  8    Creatinine 0.44 - 1.00 mg/dL 9.62  9.52  8.41   Sodium 135 - 145 mmol/L 144  144  144   Potassium 3.5 - 5.1 mmol/L 3.3  3.8  3.9   Chloride 98 - 111 mmol/L 108  108  109   CO2 22 - 32 mmol/L 30  30  28    Calcium 8.9 - 10.3 mg/dL 8.8  9.4  9.2   Total Protein 6.5 - 8.1 g/dL 6.4  6.4  6.5   Total Bilirubin 0.0 - 1.2 mg/dL 0.4  0.5  0.4   Alkaline Phos 38 - 126 U/L 62  61  64   AST 15 - 41 U/L 22  21  25    ALT 0 - 44 U/L 16  13  15       RADIOGRAPHIC STUDIES: I have personally reviewed the radiological images as listed and agreed with the findings in the report. No results found.   ASSESSMENT & PLAN:   78 y.o.  female with:  Mantle cell lymphoma- newly diagnosed. Found as mass in the sigmoid colon on colonoscopy done to evaluate for Fhx of colon cancer and chronic IBS like symptoms. IBS Fhx of breast and colon cancer.  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 CBC stable, showed WBC of 2.6K,  hemoglobin of 13.0, and platelets of 125K. -potassium level is slightly low at 3.3 mmol/L which may be related to diarrhea issues. Kidney function, liver function, and all other electrolytes are stable.  -recommend consuming potassium-rich foods, such as bananas or orange juice -did not feel any enlarged lymph nodes during physical examination -will switch to maintenance treatment regimen. -will drop chemotherapy  -she will only receive rituxan antibody today and tomorrow and continue every 2-3 months, to keep mantle cell lymphoma suppressed, which should also improve her fatigue.  -patient can complete her current course of acyclovir then stop given that she is 4 weeks out from her last Bendamustine chemotherapy.  -continue to hold allopurinol -will cut down Benadryl dose from 50 mg to 25 mg  -will hold dexamethasone at this time -take vitamin B complex regularly -take Vitamin D regularly -continue levsin as needed -answered all of patient's questions in detail  FOLLOW-UP: RTC with Dr Candise Che with labs and 1st dose of Maintenance Rituxan in 3 months  The total time spent in the appointment was 32 minutes* .  All of the patient's questions were answered with apparent satisfaction. The patient knows to call the clinic with any problems, questions or concerns.   Wyvonnia Lora MD MS AAHIVMS [facility] Hematology/Oncology Physician [facility]  .*Total Encounter Time as defined by the Centers for Medicare and Medicaid Services includes, in addition to the face-to-face time of a patient visit (documented in the note above) non-face-to-face time: obtaining and reviewing outside history, ordering and reviewing medications, tests or procedures, care coordination (communications with other health care professionals or caregivers) and documentation in the medical record.    I,Mitra Faeizi,acting as a Neurosurgeon for Wyvonnia Lora, MD.,have documented all relevant documentation on the behalf of  Wyvonnia Lora, MD,as directed by  Wyvonnia Lora, MD while in the presence of Wyvonnia Lora, MD.  .I have reviewed the above documentation for accuracy and completeness, and I agree with the above. Johney Maine MD

## 2024-01-16 NOTE — Progress Notes (Signed)
 Patient seen by Dr. Addison Naegeli are within treatment parameters.  Labs reviewed: and are within treatment parameters.  Per physician team, patient is ready for treatment. Please note that modifications are being made to the treatment plan including    Pt will be getting Rituxan only

## 2024-01-16 NOTE — Patient Instructions (Signed)
 [facility] - [facility] DE[facility]   Discharge Instructions: Thank you for choosing [facility] to provide your oncology and hematology care.   If you have a lab appointment with the Cancer Center, please go directly to the Cancer Center and check in at the registration area.   Wear comfortable clothing and clothing appropriate for easy access to any Portacath or PICC line.   We strive to give you quality time with your provider. You may need to reschedule your appointment if you arrive late (15 or more minutes).  Arriving late affects you and other patients whose appointments are after yours.  Also, if you miss three or more appointments without notifying the office, you may be dismissed from the clinic at the provider's discretion.      For prescription refill requests, have your pharmacy contact our office and allow 72 hours for refills to be completed.    Today you received the following chemotherapy and/or immunotherapy agents: Rituximab (Rituxan)      To help prevent nausea and vomiting after your treatment, we encourage you to take your nausea medication as directed.  BELOW ARE SYMPTOMS THAT SHOULD BE REPORTED IMMEDIATELY: *FEVER GREATER THAN 100.4 F (38 C) OR HIGHER *CHILLS OR SWEATING *NAUSEA AND VOMITING THAT IS NOT CONTROLLED WITH YOUR NAUSEA MEDICATION *UNUSUAL SHORTNESS OF BREATH *UNUSUAL BRUISING OR BLEEDING *URINARY PROBLEMS (pain or burning when urinating, or frequent urination) *BOWEL PROBLEMS (unusual diarrhea, constipation, pain near the anus) TENDERNESS IN MOUTH AND THROAT WITH OR WITHOUT PRESENCE OF ULCERS (sore throat, sores in mouth, or a toothache) UNUSUAL RASH, SWELLING OR PAIN  UNUSUAL VAGINAL DISCHARGE OR ITCHING   Items with * indicate a potential emergency and should be followed up as soon as possible or go to the Emergency Department if any problems should occur.  Please show the CHEMOTHERAPY ALERT CARD or  IMMUNOTHERAPY ALERT CARD at check-in to the Emergency Department and triage nurse.  Should you have questions after your visit or need to cancel or reschedule your appointment, please contact [facility] - A DEPT OF [facility]  Dept: [phone]  and follow the prompts.  Office hours are 8:00 a.m. to 4:30 p.m. Monday - Friday. Please note that voicemails left after 4:00 p.m. may not be returned until the following business day.  We are closed weekends and major holidays. You have access to a nurse at all times for urgent questions. Please call the main number to the clinic Dept: [phone] and follow the prompts.   For any non-urgent questions, you may also contact your provider using MyChart. We now offer e-Visits for anyone 74 and older to request care online for non-urgent symptoms. For details visit [email or web address].   Also download the MyChart app! Go to the app store, search "MyChart", open the app, select [place], and log in with your MyChart username and password.

## 2024-01-17 ENCOUNTER — Other Ambulatory Visit: Payer: Self-pay

## 2024-01-17 ENCOUNTER — Inpatient Hospital Stay: Payer: PPO

## 2024-01-22 ENCOUNTER — Encounter: Payer: Self-pay | Admitting: Hematology

## 2024-03-21 ENCOUNTER — Other Ambulatory Visit: Payer: Self-pay

## 2024-04-09 ENCOUNTER — Inpatient Hospital Stay: Admitting: Hematology

## 2024-04-09 ENCOUNTER — Other Ambulatory Visit: Payer: Self-pay

## 2024-04-09 ENCOUNTER — Inpatient Hospital Stay

## 2024-04-10 ENCOUNTER — Telehealth: Payer: Self-pay

## 2024-04-10 NOTE — Telephone Encounter (Signed)
 Received VM from pt. She was instructed by another provider to inform Dr. Epifania Haskell office that she is currently on Doxycycline for a respiratory infection (10-day course). She has an upcoming appt on 6/10 for maintenance Rituxan  and requests that someone notify her if this will be a problem since she will just be coming off of Doxycycline.

## 2024-04-14 NOTE — Progress Notes (Signed)
 Contacted pt regarding her taking an antibiotic. Pt ok to come in for tx next week.

## 2024-04-22 ENCOUNTER — Inpatient Hospital Stay: Attending: Hematology

## 2024-04-22 ENCOUNTER — Inpatient Hospital Stay

## 2024-04-22 ENCOUNTER — Inpatient Hospital Stay: Admitting: Hematology

## 2024-04-22 VITALS — BP 164/77 | HR 85 | Temp 97.9°F | Resp 18 | Wt 132.5 lb

## 2024-04-22 DIAGNOSIS — Z803 Family history of malignant neoplasm of breast: Secondary | ICD-10-CM | POA: Diagnosis not present

## 2024-04-22 DIAGNOSIS — K589 Irritable bowel syndrome without diarrhea: Secondary | ICD-10-CM | POA: Insufficient documentation

## 2024-04-22 DIAGNOSIS — Z9221 Personal history of antineoplastic chemotherapy: Secondary | ICD-10-CM | POA: Diagnosis not present

## 2024-04-22 DIAGNOSIS — C8310 Mantle cell lymphoma, unspecified site: Secondary | ICD-10-CM

## 2024-04-22 DIAGNOSIS — C8313 Mantle cell lymphoma, intra-abdominal lymph nodes: Secondary | ICD-10-CM | POA: Insufficient documentation

## 2024-04-22 DIAGNOSIS — Z800 Family history of malignant neoplasm of digestive organs: Secondary | ICD-10-CM | POA: Diagnosis not present

## 2024-04-22 LAB — CMP (CANCER CENTER ONLY)
ALT: 14 U/L (ref 0–44)
AST: 21 U/L (ref 15–41)
Albumin: 4.2 g/dL (ref 3.5–5.0)
Alkaline Phosphatase: 58 U/L (ref 38–126)
Anion gap: 5 (ref 5–15)
BUN: 6 mg/dL — ABNORMAL LOW (ref 8–23)
CO2: 30 mmol/L (ref 22–32)
Calcium: 9.3 mg/dL (ref 8.9–10.3)
Chloride: 106 mmol/L (ref 98–111)
Creatinine: 0.72 mg/dL (ref 0.44–1.00)
GFR, Estimated: >60 mL/min (ref >60)
Glucose, Bld: 114 mg/dL — ABNORMAL HIGH (ref 70–99)
Potassium: 3.5 mmol/L (ref 3.5–5.1)
Sodium: 141 mmol/L (ref 135–145)
Total Bilirubin: 0.5 mg/dL (ref 0.0–1.2)
Total Protein: 6.9 g/dL (ref 6.5–8.1)

## 2024-04-22 LAB — CBC WITH DIFFERENTIAL (CANCER CENTER ONLY)
Abs Immature Granulocytes: 0.01 10*3/uL (ref 0.00–0.07)
Basophils Absolute: 0 10*3/uL (ref 0.0–0.1)
Basophils Relative: 1 %
Eosinophils Absolute: 0.1 10*3/uL (ref 0.0–0.5)
Eosinophils Relative: 2 %
HCT: 39 % (ref 36.0–46.0)
Hemoglobin: 13.3 g/dL (ref 12.0–15.0)
Immature Granulocytes: 0 %
Lymphocytes Relative: 8 %
Lymphs Abs: 0.3 10*3/uL — ABNORMAL LOW (ref 0.7–4.0)
MCH: 30.9 pg (ref 26.0–34.0)
MCHC: 34.1 g/dL (ref 30.0–36.0)
MCV: 90.7 fL (ref 80.0–100.0)
Monocytes Absolute: 0.4 10*3/uL (ref 0.1–1.0)
Monocytes Relative: 9 %
Neutro Abs: 3.1 10*3/uL (ref 1.7–7.7)
Neutrophils Relative %: 80 %
Platelet Count: 144 10*3/uL — ABNORMAL LOW (ref 150–400)
RBC: 4.3 MIL/uL (ref 3.87–5.11)
RDW: 13.5 % (ref 11.5–15.5)
WBC Count: 3.8 10*3/uL — ABNORMAL LOW (ref 4.0–10.5)
nRBC: 0 % (ref 0.0–0.2)

## 2024-04-22 NOTE — Progress Notes (Signed)
 HEMATOLOGY/ONCOLOGY CLINIC NOTE  Date of Service: 04/27/24  Patient Care Team: [name], MD as PCP - General (Family Medicine)  CHIEF COMPLAINTS/PURPOSE OF CONSULTATION:  Evaluation and management of recently diagnosed mantle cell lymphoma.   HISTORY OF PRESENTING ILLNESS:   Courtney House is a wonderful 78 y.o. female who has been referred to us  by [name], MD for evaluation and management of Mantle cell lymphoma.   Patient reports having a history of IBS for several years with no recent symptoms. Her bowel symptoms, including diarrhea and abdominal cramping, have been stable and managed with Hyoscyamine.    She reports stable lack of appetite over the last few years. She notes that her weight fluctuates but denies any significant weight loss.   She denies any history of polyps in the past prior to her recent routine colonoscopy with Dr. [name] which showed multiple polyps which were noted to be tubular adenomas, however the mass in sigmoid colon which was reported to look like a lipoma on colonoscopy on biopsy turned out to show a mantle cell lymphoma.  Her previous colonoscopy was likely about 5-6 years ago, though she admits her timeline may be inaccurate.  Patient has stable bilateral leg swelling and occasional lower back pain. She denies any sudden weight loss, fever, chills, night sweats, new lumps/bumps, or swallowing issues.   She reports occasional episodes of feeling flushed with itchiness and redness in her face/head. These episodes have been occurring since a year ago. She does not tend to get acid reflux.  Patient denies any heart, lung, liver, or kidney issues. Patient is able to stay active with no significant physical limitations. She does note having neuropathy in her bilateral lower extremities, which she controls with Tramadol and Gabapentin , managed by neurology.  Patient is a never smoker and denies any alcohol use.  In regards to her fhx,  she reports that her sister has breast and colon cancer. Also, her mother has a history of brain tumor, her father has CHF, her brother has throat cancer, and her maternal uncle was diagnosed with mouth cancer and was a frequent smoker. She notes that her brother was a smoker, but likely did not have heavy use.   INTERVAL HISTORY:  Courtney House is a 78 y.o. female here for continued evaluation and management of mantle cell lymphoma. She was last seen by me on 01/16/2024 and reported fatigue, lack of taste, lack of appetite, brain fog after infusion affecting her walking, mild feet swelling, intermittent sleep disturbances, mild nausea once in a while, and diarrhea.  She reports having a respiratory tract infection. Patient was on Doxycycline for 20 days, which she has completed. She reports that she recovered from her respiratory infection on Friday, 04/18/2024. She notes that Doxycycline caused her to have stomach issues, which she addressed with yogurt/probiotics. She reports that she has not taken any probiotics since completing doxycycline.   Patient has no respiratory symptoms at this time. She denies any fever or coughing up phlegm.  Patient complains of lack of appetite though she tries to consume 3 meals daily.  She is noted to have lost 10 pounds in the last 3 months with current weight of 132 pounds.   She was seen by her gastroenterologist, Dr. [name] and there are plans for colonoscopy in August, though she notes that this has not been scheduled yet.  She reports that she sometimes has intermittent nausea. Patient denies any persistent nausea or bloating.   Patient  reports that nausea medication caused diarrhea, but notes that her stomach issues may have been from her general diarrhea issues. Patient has no active diarrhea at this time.   Patient denies any new lumps/bumps, abdominal pain, or leg swelling. She reports that her leg swelling has improved since not being on steroids with  her treatment.  She notes that she has not been particularly physically acitve during the time of her respiratory infection, but does generally stay fairly physically active otherwise.   She has no plans for travel over the summer at this time.   MEDICAL HISTORY:  Past Medical History:  Diagnosis Date   Anxiety    Carpal tunnel syndrome    Depression    Hypercholesterolemia    IBS (irritable bowel syndrome)    Insomnia    Sensory disease or syndrome 05/05/2014    SURGICAL HISTORY: Past Surgical History:  Procedure Laterality Date   CARPAL TUNNEL RELEASE Right    TUBAL LIGATION  1974    SOCIAL HISTORY: Social History   Socioeconomic History   Marital status: Widowed    Spouse name: Not on file   Number of children: 2   Years of education: 15   Highest education level: Not on file  Occupational History   Occupation: [employer]  Tobacco Use   Smoking status: Never   Smokeless tobacco: Never  Substance and Sexual Activity   Alcohol use: No    Alcohol/week: 0.0 standard drinks of alcohol   Drug use: No   Sexual activity: Not on file  Other Topics Concern   Not on file  Social History Narrative   Patient is widowed and her daughter and grandson lives with her.   Patient does drink one glass of tea daily.   Patient works on an  Theatre stage manager.   Patient has a high school education.   Patient has two adult children.   Patient is right-handed.                  Social Drivers of Corporate investment banker Strain: Not on file  Food Insecurity: Not on file  Transportation Needs: Not on file  Physical Activity: Not on file  Stress: Not on file  Social Connections: Not on file  Intimate Partner Violence: Not on file    FAMILY HISTORY: Family History  Problem Relation Age of Onset   Breast cancer Sister        lymph nodes   Parkinson's disease Brother    Throat cancer Brother    Macular degeneration Brother     ALLERGIES:  has no known  allergies.  MEDICATIONS:  Current Outpatient Medications  Medication Sig Dispense Refill   b complex vitamins capsule Take 1 capsule by mouth daily.     calcium gluconate 650 MG tablet Take 650 mg by mouth daily.     cholecalciferol (VITAMIN D ) 1000 UNITS tablet Take 1,000 Units by mouth daily.     gabapentin  (NEURONTIN ) 400 MG capsule TAKE ONE CAPSULE BY MOUTH IN THE MORNING AND TAKE 1 CAPSULE IN EVENING AND TAKE 2 CAPSULES AT BEDTIME 120 capsule 0   hyoscyamine (LEVSIN, ANASPAZ) 0.125 MG tablet Take 0.125 mg by mouth every 4 (four) hours as needed (abdominal pain).      ondansetron  (ZOFRAN ) 8 MG tablet Take 1 tablet (8 mg total) by mouth every 8 (eight) hours as needed for nausea or vomiting. Start on the third day after chemotherapy. 30 tablet 1   Prenatal Vit-Fe Fumarate-FA (PRENATAL  VITAMINS) 28-0.8 MG TABS Take 28 mg by mouth 1 day or 1 dose. (Patient taking differently: Take 28 mg by mouth daily.) 30 tablet 5   prochlorperazine  (COMPAZINE ) 10 MG tablet Take 1 tablet (10 mg total) by mouth every 6 (six) hours as needed for nausea or vomiting. 30 tablet 1   temazepam (RESTORIL) 15 MG capsule Take 15 mg by mouth at bedtime as needed for sleep.     traMADol (ULTRAM) 50 MG tablet Take 50 mg by mouth 2 (two) times daily. 50mg  in am and 50mg  in pm     No current facility-administered medications for this visit.   Facility-Administered Medications Ordered in Other Visits  Medication Dose Route Frequency Provider Last Rate Last Admin   0.9 %  sodium chloride  infusion   Intravenous Continuous Shuntay Everetts Kishore, MD 200 mL/hr at 11/21/23 1631 Infusion Verify at 11/21/23 1631    REVIEW OF SYSTEMS:    10 Point review of Systems was done is negative except as noted above.   PHYSICAL EXAMINATION: ECOG PERFORMANCE STATUS: 1 - Symptomatic but completely ambulatory  . Vitals:   04/22/24 1056  BP: (!) 164/77  Pulse: 85  Resp: 18  Temp: 97.9 F (36.6 C)  SpO2: 96%     Filed Weights    04/22/24 1056  Weight: 132 lb 8 oz (60.1 kg)    .Body mass index is 21.39 kg/m. GENERAL:alert, in no acute distress and comfortable SKIN: no acute rashes, no significant lesions EYES: conjunctiva are pink and non-injected, sclera anicteric OROPHARYNX: MMM, no exudates, no oropharyngeal erythema or ulceration NECK: supple, no JVD LYMPH:  no palpable lymphadenopathy in the cervical, axillary or inguinal regions LUNGS: clear to auscultation b/l with normal respiratory effort HEART: regular rate & rhythm ABDOMEN:  normoactive bowel sounds , non tender, not distended. Extremity: no pedal edema PSYCH: alert & oriented x 3 with fluent speech NEURO: no focal motor/sensory deficits   LABORATORY DATA:  I have reviewed the data as listed  .    Latest Ref Rng & Units 04/22/2024   10:24 AM 01/16/2024    9:39 AM 12/19/2023    9:35 AM  CBC  WBC 4.0 - 10.5 K/uL 3.8  2.6  3.2   Hemoglobin 12.0 - 15.0 g/dL 40.9  81.1  91.4   Hematocrit 36.0 - 46.0 % 39.0  39.4  38.7   Platelets 150 - 400 K/uL 144  125  114     .    Latest Ref Rng & Units 04/22/2024   10:24 AM 01/16/2024    9:39 AM 12/19/2023    9:35 AM  CMP  Glucose 70 - 99 mg/dL 782  956  84   BUN 8 - 23 mg/dL 6  7  5    Creatinine 0.44 - 1.00 mg/dL 2.13  0.86  5.78   Sodium 135 - 145 mmol/L 141  144  144   Potassium 3.5 - 5.1 mmol/L 3.5  3.3  3.8   Chloride 98 - 111 mmol/L 106  108  108   CO2 22 - 32 mmol/L 30  30  30    Calcium 8.9 - 10.3 mg/dL 9.3  8.8  9.4   Total Protein 6.5 - 8.1 g/dL 6.9  6.4  6.4   Total Bilirubin 0.0 - 1.2 mg/dL 0.5  0.4  0.5   Alkaline Phos 38 - 126 U/L 58  62  61   AST 15 - 41 U/L 21  22  21    ALT 0 -  44 U/L 14  16  13       RADIOGRAPHIC STUDIES: I have personally reviewed the radiological images as listed and agreed with the findings in the report. No results found.   ASSESSMENT & PLAN:   78 y.o. female with:  Mantle cell lymphoma- newly diagnosed. Found as mass in the sigmoid colon on colonoscopy  done to evaluate for Fhx of colon cancer and chronic IBS like symptoms. IBS Fhx of breast and colon cancer.  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 CBC normal, showed WBC of 3.8K, hemoglobin of 13.3, and platelets of 144K. -platelets which were previously slightly low after chemotherapy are nearly normal at this time -previous mild anemia from chemotherapy has resolved -WBC improved -CMP stable -patient has completed dose-reduced BR treatment -given risks vs benefits, we will delay her maintenance Rituxan  by 1 month to allow patient to recover from URI and focus on eating as well as she can -discussed that even if there is no active upper respiratory infection at this time, it takes some time for the airways, appetite, and bowels to improve  -discussed that doxycycline can cause some changes in bowel habits. However if her bowel issues are persistent, there would be a role for stool testing for C. diff.  -PET scan from 11/29/2023 showed no activity in the GI tract -discussed that nausea medical generally does not cause diarrhea -recommend to continue probiotics for at least 2 weeks after Doxycycline -discussed option to take yogurt with live cultures/probiotics -patient shall connect with Dr. Iven Mark office to schedule colonoscopy appointment if she does not hear from his office soon -answered all of patient's questions in detail  FOLLOW-UP: PLZ Reschedule Rituxan  out 4 weeks from today (due to patients URI)  The total time spent in the appointment was 30 minutes* .  All of the patient's questions were answered with apparent satisfaction. The patient knows to call the clinic with any problems, questions or concerns.   Jacquelyn Matt MD MS AAHIVMS [facility] Hematology/Oncology Physician [facility]  .*Total Encounter Time as defined by the Centers for Medicare  and Medicaid Services includes, in addition to the face-to-face time of a patient visit (documented in the note above) non-face-to-face time: obtaining and reviewing outside history, ordering and reviewing medications, tests or procedures, care coordination (communications with other health care professionals or caregivers) and documentation in the medical record.    I,Mitra Faeizi,acting as a Neurosurgeon for Jacquelyn Matt, MD.,have documented all relevant documentation on the behalf of Jacquelyn Matt, MD,as directed by  Jacquelyn Matt, MD while in the presence of Jacquelyn Matt, MD.  .I have reviewed the above documentation for accuracy and completeness, and I agree with the above. .Shakenya Stoneberg Kishore Jakyiah Briones MD

## 2024-04-25 ENCOUNTER — Other Ambulatory Visit: Payer: Self-pay

## 2024-04-27 ENCOUNTER — Encounter: Payer: Self-pay | Admitting: Hematology

## 2024-05-03 ENCOUNTER — Other Ambulatory Visit: Payer: Self-pay

## 2024-05-21 ENCOUNTER — Ambulatory Visit

## 2024-05-21 ENCOUNTER — Other Ambulatory Visit

## 2024-05-30 ENCOUNTER — Other Ambulatory Visit: Payer: Self-pay

## 2024-05-30 DIAGNOSIS — C8310 Mantle cell lymphoma, unspecified site: Secondary | ICD-10-CM

## 2024-06-01 NOTE — Progress Notes (Signed)
 HEMATOLOGY/ONCOLOGY CLINIC NOTE  Date of Service: 06/02/2024  Patient Care Team: Leonel Cole, MD as PCP - General (Family Medicine)  CHIEF COMPLAINTS/PURPOSE OF CONSULTATION:  Evaluation and continued management of mantle cell lymphoma.   HISTORY OF PRESENTING ILLNESS:   Courtney House is a wonderful 78 y.o. female who has been referred to us  by Elicia Claw, MD for evaluation and management of Mantle cell lymphoma.   Patient reports having a history of IBS for several years with no recent symptoms. Her bowel symptoms, including diarrhea and abdominal cramping, have been stable and managed with Hyoscyamine.    She reports stable lack of appetite over the last few years. She notes that her weight fluctuates but denies any significant weight loss.   She denies any history of polyps in the past prior to her recent routine colonoscopy with Dr. Elicia which showed multiple polyps which were noted to be tubular adenomas, however the mass in sigmoid colon which was reported to look like a lipoma on colonoscopy on biopsy turned out to show a mantle cell lymphoma.  Her previous colonoscopy was likely about 5-6 years ago, though she admits her timeline may be inaccurate.  Patient has stable bilateral leg swelling and occasional lower back pain. She denies any sudden weight loss, fever, chills, night sweats, new lumps/bumps, or swallowing issues.   She reports occasional episodes of feeling flushed with itchiness and redness in her face/head. These episodes have been occurring since a year ago. She does not tend to get acid reflux.  Patient denies any heart, lung, liver, or kidney issues. Patient is able to stay active with no significant physical limitations. She does note having neuropathy in her bilateral lower extremities, which she controls with Tramadol and Gabapentin , managed by neurology.  Patient is a never smoker and denies any alcohol use.  In regards to her fhx, she  reports that her sister has breast and colon cancer. Also, her mother has a history of brain tumor, her father has CHF, her brother has throat cancer, and her maternal uncle was diagnosed with mouth cancer and was a frequent smoker. She notes that her brother was a smoker, but likely did not have heavy use.   INTERVAL HISTORY:  Courtney House is a 78 y.o. female here for continued evaluation and management of mantle cell lymphoma. She was last seen by me on 04/22/2024 and reported having a URI which had recovered, lack of appetite, loss of 10 pounds over 3 months, intermittent nausea, and some intermittent diarrhea.  Patient URI has resolved. No fevers no new lumps of bumps. Has irregular bowel habits chronically from her IBS and more so from recent antibiotic use.  No SOB. She is keen to get started with her maintenance Rituxan . SHe has f/u with GI to setup her colonoscopy.  MEDICAL HISTORY:  Past Medical History:  Diagnosis Date   Anxiety    Carpal tunnel syndrome    Depression    Hypercholesterolemia    IBS (irritable bowel syndrome)    Insomnia    Sensory disease or syndrome 05/05/2014    SURGICAL HISTORY: Past Surgical History:  Procedure Laterality Date   CARPAL TUNNEL RELEASE Right    TUBAL LIGATION  1974    SOCIAL HISTORY: Social History   Socioeconomic History   Marital status: Widowed    Spouse name: Not on file   Number of children: 2   Years of education: 67   Highest education level: Not on file  Occupational History   Occupation: [employer]  Tobacco Use   Smoking status: Never   Smokeless tobacco: Never  Substance and Sexual Activity   Alcohol use: No    Alcohol/week: 0.0 standard drinks of alcohol   Drug use: No   Sexual activity: Not on file  Other Topics Concern   Not on file  Social History Narrative   Patient is widowed and her daughter and grandson lives with her.   Patient does drink one glass of tea daily.   Patient works on an  Theatre stage manager.    Patient has a high school education.   Patient has two adult children.   Patient is right-handed.                  Social Drivers of Corporate investment banker Strain: Not on file  Food Insecurity: Not on file  Transportation Needs: Not on file  Physical Activity: Not on file  Stress: Not on file  Social Connections: Not on file  Intimate Partner Violence: Not on file    FAMILY HISTORY: Family History  Problem Relation Age of Onset   Breast cancer Sister        lymph nodes   Parkinson's disease Brother    Throat cancer Brother    Macular degeneration Brother     ALLERGIES:  has no known allergies.  MEDICATIONS:  Current Outpatient Medications  Medication Sig Dispense Refill   b complex vitamins capsule Take 1 capsule by mouth daily.     calcium gluconate 650 MG tablet Take 650 mg by mouth daily.     cholecalciferol (VITAMIN D ) 1000 UNITS tablet Take 1,000 Units by mouth daily.     gabapentin  (NEURONTIN ) 400 MG capsule TAKE ONE CAPSULE BY MOUTH IN THE MORNING AND TAKE 1 CAPSULE IN EVENING AND TAKE 2 CAPSULES AT BEDTIME 120 capsule 0   hyoscyamine (LEVSIN, ANASPAZ) 0.125 MG tablet Take 0.125 mg by mouth every 4 (four) hours as needed (abdominal pain).      ondansetron  (ZOFRAN ) 8 MG tablet Take 1 tablet (8 mg total) by mouth every 8 (eight) hours as needed for nausea or vomiting. Start on the third day after chemotherapy. 30 tablet 1   Prenatal Vit-Fe Fumarate-FA (PRENATAL VITAMINS) 28-0.8 MG TABS Take 28 mg by mouth 1 day or 1 dose. (Patient taking differently: Take 28 mg by mouth daily.) 30 tablet 5   prochlorperazine  (COMPAZINE ) 10 MG tablet Take 1 tablet (10 mg total) by mouth every 6 (six) hours as needed for nausea or vomiting. 30 tablet 1   temazepam (RESTORIL) 15 MG capsule Take 15 mg by mouth at bedtime as needed for sleep.     traMADol (ULTRAM) 50 MG tablet Take 50 mg by mouth 2 (two) times daily. 50mg  in am and 50mg  in pm     No current facility-administered  medications for this visit.   Facility-Administered Medications Ordered in Other Visits  Medication Dose Route Frequency Provider Last Rate Last Admin   0.9 %  sodium chloride  infusion   Intravenous Continuous Taylin Leder Kishore, MD 200 mL/hr at 11/21/23 1631 Infusion Verify at 11/21/23 1631    REVIEW OF SYSTEMS:    10 Point review of Systems was done is negative except as noted above.   PHYSICAL EXAMINATION: ECOG PERFORMANCE STATUS: 1 - Symptomatic but completely ambulatory  . Vitals:   06/02/24 1144  BP: (!) 149/73  Pulse: 98  Resp: 20  Temp: 98.1 F (36.7 C)  SpO2: 96%  Filed Weights   06/02/24 1144  Weight: 129 lb 8 oz (58.7 kg)     .Body mass index is 20.9 kg/m.  GENERAL:alert, in no acute distress and comfortable SKIN: no acute rashes, no significant lesions EYES: conjunctiva are pink and non-injected, sclera anicteric OROPHARYNX: MMM, no exudates, no oropharyngeal erythema or ulceration NECK: supple, no JVD LYMPH:  no palpable lymphadenopathy in the cervical, axillary or inguinal regions LUNGS: clear to auscultation b/l with normal respiratory effort HEART: regular rate & rhythm ABDOMEN:  normoactive bowel sounds , non tender, not distended. Extremity: no pedal edema PSYCH: alert & oriented x 3 with fluent speech NEURO: no focal motor/sensory deficits   LABORATORY DATA:  I have reviewed the data as listed  .    Latest Ref Rng & Units 06/02/2024   11:13 AM 04/22/2024   10:24 AM 01/16/2024    9:39 AM  CBC  WBC 4.0 - 10.5 K/uL 10.6  3.8  2.6   Hemoglobin 12.0 - 15.0 g/dL 87.4  86.6  86.9   Hematocrit 36.0 - 46.0 % 37.4  39.0  39.4   Platelets 150 - 400 K/uL 313  144  125     .    Latest Ref Rng & Units 06/02/2024   11:13 AM 04/22/2024   10:24 AM 01/16/2024    9:39 AM  CMP  Glucose 70 - 99 mg/dL 871  885  874   BUN 8 - 23 mg/dL 7  6  7    Creatinine 0.44 - 1.00 mg/dL 9.29  9.27  9.27   Sodium 135 - 145 mmol/L 140  141  144   Potassium 3.5 -  5.1 mmol/L 3.9  3.5  3.3   Chloride 98 - 111 mmol/L 103  106  108   CO2 22 - 32 mmol/L 30  30  30    Calcium 8.9 - 10.3 mg/dL 9.2  9.3  8.8   Total Protein 6.5 - 8.1 g/dL 6.8  6.9  6.4   Total Bilirubin 0.0 - 1.2 mg/dL 0.4  0.5  0.4   Alkaline Phos 38 - 126 U/L 78  58  62   AST 15 - 41 U/L 29  21  22    ALT 0 - 44 U/L 27  14  16       RADIOGRAPHIC STUDIES: I have personally reviewed the radiological images as listed and agreed with the findings in the report. No results found.   ASSESSMENT & PLAN:   78 y.o. female with:  Mantle cell lymphoma- newly diagnosed. Found as mass in the sigmoid colon on colonoscopy done to evaluate for Fhx of colon cancer and chronic IBS like symptoms. IBS Fhx of breast and colon cancer.  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  - no evidence of active infection at this time. 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 -she has been taken yogurt and probiotics with her recent antibiotic use. -patient will proceed with 1st cycles of maintenance Rituximab  today.  FOLLOW-UP: Plz schedule next 2 cycles of maintenance Rituximab  q60 days per integrated scheduling   The total time spent in the appointment was 30 minutes* .  All of the patient's questions were answered with apparent satisfaction. The patient knows to call the clinic with any  problems, questions or concerns.   Emaline Saran MD MS AAHIVMS [facility] Hematology/Oncology Physician [facility]  .*Total Encounter Time as defined by the Centers for Medicare and Medicaid Services includes, in addition to the face-to-face time of a patient visit (documented in the note above) non-face-to-face time: obtaining and  reviewing outside history, ordering and reviewing medications, tests or procedures, care coordination (communications with other health care professionals or caregivers) and documentation in the medical record.   The total time spent in the appointment was 30 minutes* .  All of the patient's questions were answered with apparent satisfaction. The patient knows to call the clinic with any problems, questions or concerns.   Emaline Saran MD MS AAHIVMS [facility] Hematology/Oncology Physician [facility]  .*Total Encounter Time as defined by the Centers for Medicare and Medicaid Services includes, in addition to the face-to-face time of a patient visit (documented in the note above) non-face-to-face time: obtaining and reviewing outside history, ordering and reviewing medications, tests or procedures, care coordination (communications with other health care professionals or caregivers) and documentation in the medical record.     I,Mitra Faeizi,acting as a Neurosurgeon for Emaline Saran, MD.,have documented all relevant documentation on the behalf of Emaline Saran, MD,as directed by  Emaline Saran, MD while in the presence of Emaline Saran, MD.  .I have reviewed the above documentation for accuracy and completeness, and I agree with the above. .Taylia Berber Kishore Sandhya Denherder MD

## 2024-06-02 ENCOUNTER — Inpatient Hospital Stay: Attending: Hematology

## 2024-06-02 ENCOUNTER — Inpatient Hospital Stay: Admitting: Hematology

## 2024-06-02 VITALS — BP 149/73 | HR 98 | Temp 98.1°F | Resp 20 | Wt 129.5 lb

## 2024-06-02 VITALS — BP 144/72 | HR 86 | Temp 98.5°F | Resp 16

## 2024-06-02 DIAGNOSIS — Z803 Family history of malignant neoplasm of breast: Secondary | ICD-10-CM | POA: Diagnosis not present

## 2024-06-02 DIAGNOSIS — R0982 Postnasal drip: Secondary | ICD-10-CM | POA: Insufficient documentation

## 2024-06-02 DIAGNOSIS — Z5112 Encounter for antineoplastic immunotherapy: Secondary | ICD-10-CM | POA: Insufficient documentation

## 2024-06-02 DIAGNOSIS — R053 Chronic cough: Secondary | ICD-10-CM | POA: Insufficient documentation

## 2024-06-02 DIAGNOSIS — Z5111 Encounter for antineoplastic chemotherapy: Secondary | ICD-10-CM | POA: Diagnosis not present

## 2024-06-02 DIAGNOSIS — Z800 Family history of malignant neoplasm of digestive organs: Secondary | ICD-10-CM | POA: Insufficient documentation

## 2024-06-02 DIAGNOSIS — K580 Irritable bowel syndrome with diarrhea: Secondary | ICD-10-CM | POA: Insufficient documentation

## 2024-06-02 DIAGNOSIS — C8310 Mantle cell lymphoma, unspecified site: Secondary | ICD-10-CM

## 2024-06-02 DIAGNOSIS — C8313 Mantle cell lymphoma, intra-abdominal lymph nodes: Secondary | ICD-10-CM | POA: Insufficient documentation

## 2024-06-02 LAB — CMP (CANCER CENTER ONLY)
ALT: 27 U/L (ref 0–44)
AST: 29 U/L (ref 15–41)
Albumin: 3.6 g/dL (ref 3.5–5.0)
Alkaline Phosphatase: 78 U/L (ref 38–126)
Anion gap: 7 (ref 5–15)
BUN: 7 mg/dL — ABNORMAL LOW (ref 8–23)
CO2: 30 mmol/L (ref 22–32)
Calcium: 9.2 mg/dL (ref 8.9–10.3)
Chloride: 103 mmol/L (ref 98–111)
Creatinine: 0.7 mg/dL (ref 0.44–1.00)
GFR, Estimated: >60 mL/min (ref >60)
Glucose, Bld: 128 mg/dL — ABNORMAL HIGH (ref 70–99)
Potassium: 3.9 mmol/L (ref 3.5–5.1)
Sodium: 140 mmol/L (ref 135–145)
Total Bilirubin: 0.4 mg/dL (ref 0.0–1.2)
Total Protein: 6.8 g/dL (ref 6.5–8.1)

## 2024-06-02 LAB — CBC WITH DIFFERENTIAL (CANCER CENTER ONLY)
Abs Immature Granulocytes: 0.04 K/uL (ref 0.00–0.07)
Basophils Absolute: 0 K/uL (ref 0.0–0.1)
Basophils Relative: 0 %
Eosinophils Absolute: 0.1 K/uL (ref 0.0–0.5)
Eosinophils Relative: 1 %
HCT: 37.4 % (ref 36.0–46.0)
Hemoglobin: 12.5 g/dL (ref 12.0–15.0)
Immature Granulocytes: 0 %
Lymphocytes Relative: 2 %
Lymphs Abs: 0.2 K/uL — ABNORMAL LOW (ref 0.7–4.0)
MCH: 30.4 pg (ref 26.0–34.0)
MCHC: 33.4 g/dL (ref 30.0–36.0)
MCV: 91 fL (ref 80.0–100.0)
Monocytes Absolute: 0.9 K/uL (ref 0.1–1.0)
Monocytes Relative: 8 %
Neutro Abs: 9.4 K/uL — ABNORMAL HIGH (ref 1.7–7.7)
Neutrophils Relative %: 89 %
Platelet Count: 313 K/uL (ref 150–400)
RBC: 4.11 MIL/uL (ref 3.87–5.11)
RDW: 13.8 % (ref 11.5–15.5)
WBC Count: 10.6 K/uL — ABNORMAL HIGH (ref 4.0–10.5)
nRBC: 0 % (ref 0.0–0.2)

## 2024-06-02 MED ORDER — ACETAMINOPHEN 325 MG PO TABS
650.0000 mg | ORAL_TABLET | Freq: Once | ORAL | Status: AC
  Administered 2024-06-02: 650 mg via ORAL
  Filled 2024-06-02: qty 2

## 2024-06-02 MED ORDER — DEXAMETHASONE SODIUM PHOSPHATE 10 MG/ML IJ SOLN
10.0000 mg | Freq: Once | INTRAMUSCULAR | Status: AC
  Administered 2024-06-02: 10 mg via INTRAVENOUS
  Filled 2024-06-02: qty 1

## 2024-06-02 MED ORDER — SODIUM CHLORIDE 0.9 % IV SOLN
375.0000 mg/m2 | Freq: Once | INTRAVENOUS | Status: AC
  Administered 2024-06-02: 700 mg via INTRAVENOUS
  Filled 2024-06-02: qty 50

## 2024-06-02 MED ORDER — SODIUM CHLORIDE 0.9 % IV SOLN: INTRAVENOUS | Status: DC

## 2024-06-02 MED ORDER — DIPHENHYDRAMINE HCL 25 MG PO CAPS
25.0000 mg | ORAL_CAPSULE | Freq: Once | ORAL | Status: AC
  Administered 2024-06-02: 25 mg via ORAL
  Filled 2024-06-02: qty 1

## 2024-06-07 ENCOUNTER — Other Ambulatory Visit: Payer: Self-pay

## 2024-06-09 ENCOUNTER — Encounter: Payer: Self-pay | Admitting: Hematology

## 2024-06-12 ENCOUNTER — Encounter: Payer: Self-pay | Admitting: Hematology

## 2024-06-15 ENCOUNTER — Other Ambulatory Visit: Payer: Self-pay

## 2024-08-02 ENCOUNTER — Other Ambulatory Visit: Payer: Self-pay

## 2024-08-04 ENCOUNTER — Inpatient Hospital Stay: Attending: Hematology

## 2024-08-04 ENCOUNTER — Inpatient Hospital Stay

## 2024-08-04 ENCOUNTER — Inpatient Hospital Stay: Admitting: Hematology

## 2024-08-04 VITALS — BP 148/69 | HR 68 | Resp 17

## 2024-08-04 VITALS — BP 126/85 | HR 95 | Temp 98.1°F | Resp 18 | Wt 126.3 lb

## 2024-08-04 DIAGNOSIS — Z5111 Encounter for antineoplastic chemotherapy: Secondary | ICD-10-CM | POA: Diagnosis not present

## 2024-08-04 DIAGNOSIS — C8313 Mantle cell lymphoma, intra-abdominal lymph nodes: Secondary | ICD-10-CM | POA: Diagnosis not present

## 2024-08-04 DIAGNOSIS — C8310 Mantle cell lymphoma, unspecified site: Secondary | ICD-10-CM

## 2024-08-04 DIAGNOSIS — Z5112 Encounter for antineoplastic immunotherapy: Secondary | ICD-10-CM | POA: Diagnosis present

## 2024-08-04 LAB — CBC WITH DIFFERENTIAL (CANCER CENTER ONLY)
Abs Immature Granulocytes: 0.02 K/uL (ref 0.00–0.07)
Basophils Absolute: 0 K/uL (ref 0.0–0.1)
Basophils Relative: 0 %
Eosinophils Absolute: 0.1 K/uL (ref 0.0–0.5)
Eosinophils Relative: 1 %
HCT: 40.1 % (ref 36.0–46.0)
Hemoglobin: 13.2 g/dL (ref 12.0–15.0)
Immature Granulocytes: 0 %
Lymphocytes Relative: 5 %
Lymphs Abs: 0.2 K/uL — ABNORMAL LOW (ref 0.7–4.0)
MCH: 30.5 pg (ref 26.0–34.0)
MCHC: 32.9 g/dL (ref 30.0–36.0)
MCV: 92.6 fL (ref 80.0–100.0)
Monocytes Absolute: 0.4 K/uL (ref 0.1–1.0)
Monocytes Relative: 8 %
Neutro Abs: 4.3 K/uL (ref 1.7–7.7)
Neutrophils Relative %: 86 %
Platelet Count: 144 K/uL — ABNORMAL LOW (ref 150–400)
RBC: 4.33 MIL/uL (ref 3.87–5.11)
RDW: 14.8 % (ref 11.5–15.5)
WBC Count: 5.1 K/uL (ref 4.0–10.5)
nRBC: 0 % (ref 0.0–0.2)

## 2024-08-04 LAB — CMP (CANCER CENTER ONLY)
ALT: 14 U/L (ref 0–44)
AST: 23 U/L (ref 15–41)
Albumin: 4 g/dL (ref 3.5–5.0)
Alkaline Phosphatase: 62 U/L (ref 38–126)
Anion gap: 5 (ref 5–15)
BUN: 6 mg/dL — ABNORMAL LOW (ref 8–23)
CO2: 31 mmol/L (ref 22–32)
Calcium: 9.1 mg/dL (ref 8.9–10.3)
Chloride: 108 mmol/L (ref 98–111)
Creatinine: 0.66 mg/dL (ref 0.44–1.00)
GFR, Estimated: >60 mL/min (ref >60)
Glucose, Bld: 95 mg/dL (ref 70–99)
Potassium: 3.8 mmol/L (ref 3.5–5.1)
Sodium: 144 mmol/L (ref 135–145)
Total Bilirubin: 0.5 mg/dL (ref 0.0–1.2)
Total Protein: 6.6 g/dL (ref 6.5–8.1)

## 2024-08-04 MED ORDER — DEXAMETHASONE SODIUM PHOSPHATE 10 MG/ML IJ SOLN
10.0000 mg | Freq: Once | INTRAMUSCULAR | Status: AC
  Administered 2024-08-04: 10 mg via INTRAVENOUS
  Filled 2024-08-04: qty 1

## 2024-08-04 MED ORDER — DIPHENHYDRAMINE HCL 25 MG PO CAPS
25.0000 mg | ORAL_CAPSULE | Freq: Once | ORAL | Status: AC
  Administered 2024-08-04: 25 mg via ORAL
  Filled 2024-08-04: qty 1

## 2024-08-04 MED ORDER — SODIUM CHLORIDE 0.9 % IV SOLN
375.0000 mg/m2 | Freq: Once | INTRAVENOUS | Status: AC
  Administered 2024-08-04: 700 mg via INTRAVENOUS
  Filled 2024-08-04: qty 50

## 2024-08-04 MED ORDER — SODIUM CHLORIDE 0.9 % IV SOLN: INTRAVENOUS | Status: DC

## 2024-08-04 MED ORDER — ACETAMINOPHEN 325 MG PO TABS
650.0000 mg | ORAL_TABLET | Freq: Once | ORAL | Status: AC
  Administered 2024-08-04: 650 mg via ORAL
  Filled 2024-08-04: qty 2

## 2024-08-04 NOTE — Progress Notes (Signed)
 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  Past medical history of IBS, anxiety, HLD. Patient receiving rituxamab.  Met with patient during infusion.  Reports that she does not have much of an appetite and that it has been going on for awhile.  I still make myself eat 3 meals a day.  Breakfast is usually sausage biscuit (1/2), grits, yogurt, juice.  Lunch is slider roll with [place], pickles and yogurt with fruited jello.  Supper is vegetables with meat and potato.  Drinks Fairlife shakes but not daily.  Says that she took one of the nausea medications and it made her sleepy so does not want to take it again    Medications: b complex vitamins, compazine , zofran , vit D, calcium gluconate  Labs: reviewed  Anthropometrics:   Height: 66 inches Weight: 126 lb  144 lb 07/03/23 BMI: 20  12% weight loss in the last year, concerning   Estimated Energy Needs  Kcals: 1425-1700 Protein: 71-85 g Fluid: >  NUTRITION DIAGNOSIS: Unintentional weight loss related to cancer as evidenced by 12% weight loss in the last year and poor po intake   INTERVENTION:  Discussed ways to add calories and protein in diet.  Handout provided Encouraged Fairlife shake or equivalent at least daily Contact information provided    MONITORING, EVALUATION, GOAL: weight trends, intake   NEXT VISIT: to be determined with treatment  Courtney House SOLON, CSO, LDN Registered Dietitian [phone]

## 2024-08-04 NOTE — Patient Instructions (Signed)
 [facility] - A DEPT OF [facility]  Discharge Instructions: Thank you for choosing [facility] to provide your oncology and hematology care.   If you have a lab appointment with the Cancer Center, please go directly to the Cancer Center and check in at the registration area.   Wear comfortable clothing and clothing appropriate for easy access to any Portacath or PICC line.   We strive to give you quality time with your provider. You may need to reschedule your appointment if you arrive late (15 or more minutes).  Arriving late affects you and other patients whose appointments are after yours.  Also, if you miss three or more appointments without notifying the office, you may be dismissed from the clinic at the provider's discretion.      For prescription refill requests, have your pharmacy contact our office and allow 72 hours for refills to be completed.    Today you received the following chemotherapy and/or immunotherapy agents: riTUXimab -pvvr (RUXIENCE )     To help prevent nausea and vomiting after your treatment, we encourage you to take your nausea medication as directed.  BELOW ARE SYMPTOMS THAT SHOULD BE REPORTED IMMEDIATELY: *FEVER GREATER THAN 100.4 F (38 C) OR HIGHER *CHILLS OR SWEATING *NAUSEA AND VOMITING THAT IS NOT CONTROLLED WITH YOUR NAUSEA MEDICATION *UNUSUAL SHORTNESS OF BREATH *UNUSUAL BRUISING OR BLEEDING *URINARY PROBLEMS (pain or burning when urinating, or frequent urination) *BOWEL PROBLEMS (unusual diarrhea, constipation, pain near the anus) TENDERNESS IN MOUTH AND THROAT WITH OR WITHOUT PRESENCE OF ULCERS (sore throat, sores in mouth, or a toothache) UNUSUAL RASH, SWELLING OR PAIN  UNUSUAL VAGINAL DISCHARGE OR ITCHING   Items with * indicate a potential emergency and should be followed up as soon as possible or go to the Emergency Department if any problems should occur.  Please show the CHEMOTHERAPY ALERT CARD or  IMMUNOTHERAPY ALERT CARD at check-in to the Emergency Department and triage nurse.  Should you have questions after your visit or need to cancel or reschedule your appointment, please contact [facility] - A DEPT OF [facility]  Dept: [phone]  and follow the prompts.  Office hours are 8:00 a.m. to 4:30 p.m. Monday - Friday. Please note that voicemails left after 4:00 p.m. may not be returned until the following business day.  We are closed weekends and major holidays. You have access to a nurse at all times for urgent questions. Please call the main number to the clinic Dept: [phone] and follow the prompts.   For any non-urgent questions, you may also contact your provider using MyChart. We now offer e-Visits for anyone 65 and older to request care online for non-urgent symptoms. For details visit [email or web address].   Also download the MyChart app! Go to the app store, search "MyChart", open the app, select [facility], and log in with your MyChart username and password.

## 2024-08-05 ENCOUNTER — Other Ambulatory Visit: Payer: Self-pay

## 2024-08-08 ENCOUNTER — Other Ambulatory Visit: Payer: Self-pay

## 2024-08-10 ENCOUNTER — Encounter: Payer: Self-pay | Admitting: Hematology

## 2024-08-10 NOTE — Progress Notes (Signed)
 HEMATOLOGY/ONCOLOGY CLINIC NOTE  Date of Service: .08/04/2024  Patient Care Team: Leonel Cole, MD as PCP - General (Family Medicine)  CHIEF COMPLAINTS/PURPOSE OF CONSULTATION:  Follow-up for continued evaluation and management of mantle cell lymphoma  HISTORY OF PRESENTING ILLNESS:   Courtney House is a wonderful 78 y.o. female who has been referred to us  by Elicia Claw, MD for evaluation and management of Mantle cell lymphoma.   Patient reports having a history of IBS for several years with no recent symptoms. Her bowel symptoms, including diarrhea and abdominal cramping, have been stable and managed with Hyoscyamine.    She reports stable lack of appetite over the last few years. She notes that her weight fluctuates but denies any significant weight loss.   She denies any history of polyps in the past prior to her recent routine colonoscopy with Dr. Elicia which showed multiple polyps which were noted to be tubular adenomas, however the mass in sigmoid colon which was reported to look like a lipoma on colonoscopy on biopsy turned out to show a mantle cell lymphoma.  Her previous colonoscopy was likely about 5-6 years ago, though she admits her timeline may be inaccurate.  Patient has stable bilateral leg swelling and occasional lower back pain. She denies any sudden weight loss, fever, chills, night sweats, new lumps/bumps, or swallowing issues.   She reports occasional episodes of feeling flushed with itchiness and redness in her face/head. These episodes have been occurring since a year ago. She does not tend to get acid reflux.  Patient denies any heart, lung, liver, or kidney issues. Patient is able to stay active with no significant physical limitations. She does note having neuropathy in her bilateral lower extremities, which she controls with Tramadol and Gabapentin , managed by neurology.  Patient is a never smoker and denies any alcohol use.  In regards to her  fhx, she reports that her sister has breast and colon cancer. Also, her mother has a history of brain tumor, her father has CHF, her brother has throat cancer, and her maternal uncle was diagnosed with mouth cancer and was a frequent smoker. She notes that her brother was a smoker, but likely did not have heavy use.   INTERVAL HISTORY:  Courtney House is a 78 y.o. female is here for continued evaluation and management of mantle cell lymphoma and neck cycle of maintenance Rituxan .  She notes no acute new symptoms.  Still having alternating constipation diarrhea and IBS-like symptomatology which has been chronic. No new lumps or bumps.  No fevers no chills no night sweats no unexpected weight loss. No notable toxicities from her last dose of treatment. She notes that she has an upcoming colonoscopy scheduled in October with Dr. Elicia.  MEDICAL HISTORY:  Past Medical History:  Diagnosis Date   Anxiety    Carpal tunnel syndrome    Depression    Hypercholesterolemia    IBS (irritable bowel syndrome)    Insomnia    Sensory disease or syndrome 05/05/2014    SURGICAL HISTORY: Past Surgical History:  Procedure Laterality Date   CARPAL TUNNEL RELEASE Right    TUBAL LIGATION  1974    SOCIAL HISTORY: Social History   Socioeconomic History   Marital status: Widowed    Spouse name: Not on file   Number of children: 2   Years of education: 49   Highest education level: Not on file  Occupational History   Occupation: [employer]  Tobacco Use   Smoking status:  Never   Smokeless tobacco: Never  Substance and Sexual Activity   Alcohol use: No    Alcohol/week: 0.0 standard drinks of alcohol   Drug use: No   Sexual activity: Not on file  Other Topics Concern   Not on file  Social History Narrative   Patient is widowed and her daughter and grandson lives with her.   Patient does drink one glass of tea daily.   Patient works on an  Theatre stage manager.   Patient has a high school education.    Patient has two adult children.   Patient is right-handed.                  Social Drivers of Corporate investment banker Strain: Not on file  Food Insecurity: Not on file  Transportation Needs: Not on file  Physical Activity: Not on file  Stress: Not on file  Social Connections: Not on file  Intimate Partner Violence: Not on file    FAMILY HISTORY: Family History  Problem Relation Age of Onset   Breast cancer Sister        lymph nodes   Parkinson's disease Brother    Throat cancer Brother    Macular degeneration Brother     ALLERGIES:  has no known allergies.  MEDICATIONS:  Current Outpatient Medications  Medication Sig Dispense Refill   b complex vitamins capsule Take 1 capsule by mouth daily.     calcium gluconate 650 MG tablet Take 650 mg by mouth daily.     cholecalciferol (VITAMIN D ) 1000 UNITS tablet Take 1,000 Units by mouth daily.     gabapentin  (NEURONTIN ) 400 MG capsule TAKE ONE CAPSULE BY MOUTH IN THE MORNING AND TAKE 1 CAPSULE IN EVENING AND TAKE 2 CAPSULES AT BEDTIME 120 capsule 0   hyoscyamine (LEVSIN, ANASPAZ) 0.125 MG tablet Take 0.125 mg by mouth every 4 (four) hours as needed (abdominal pain).      ondansetron  (ZOFRAN ) 8 MG tablet Take 1 tablet (8 mg total) by mouth every 8 (eight) hours as needed for nausea or vomiting. Start on the third day after chemotherapy. 30 tablet 1   Prenatal Vit-Fe Fumarate-FA (PRENATAL VITAMINS) 28-0.8 MG TABS Take 28 mg by mouth 1 day or 1 dose. (Patient taking differently: Take 28 mg by mouth daily.) 30 tablet 5   prochlorperazine  (COMPAZINE ) 10 MG tablet Take 1 tablet (10 mg total) by mouth every 6 (six) hours as needed for nausea or vomiting. 30 tablet 1   temazepam (RESTORIL) 15 MG capsule Take 15 mg by mouth at bedtime as needed for sleep.     traMADol (ULTRAM) 50 MG tablet Take 50 mg by mouth 2 (two) times daily. 50mg  in am and 50mg  in pm     No current facility-administered medications for this visit.    Facility-Administered Medications Ordered in Other Visits  Medication Dose Route Frequency Provider Last Rate Last Admin   0.9 %  sodium chloride  infusion   Intravenous Continuous Lipa Knauff Kishore, MD 200 mL/hr at 11/21/23 1631 Infusion Verify at 11/21/23 1631    REVIEW OF SYSTEMS:   .10 Point review of Systems was done is negative except as noted above.  PHYSICAL EXAMINATION: ECOG PERFORMANCE STATUS: 1 - Symptomatic but completely ambulatory  . Vitals:   08/04/24 1140  BP: 126/85  Pulse: 95  Resp: 18  Temp: 98.1 F (36.7 C)  SpO2: 97%    Filed Weights   08/04/24 1140  Weight: 126 lb 4.8 oz (57.3  kg)  .Body mass index is 20.39 kg/m. SABRA GENERAL:alert, in no acute distress and comfortable SKIN: no acute rashes, no significant lesions EYES: conjunctiva are pink and non-injected, sclera anicteric OROPHARYNX: MMM, no exudates, no oropharyngeal erythema or ulceration NECK: supple, no JVD LYMPH:  no palpable lymphadenopathy in the cervical, axillary or inguinal regions LUNGS: clear to auscultation b/l with normal respiratory effort HEART: regular rate & rhythm ABDOMEN:  normoactive bowel sounds , non tender, not distended. Extremity: no pedal edema PSYCH: alert & oriented x 3 with fluent speech NEURO: no focal motor/sensory deficits  LABORATORY DATA:  I have reviewed the data as listed  .    Latest Ref Rng & Units 08/04/2024   11:24 AM 06/02/2024   11:13 AM 04/22/2024   10:24 AM  CBC  WBC 4.0 - 10.5 K/uL 5.1  10.6  3.8   Hemoglobin 12.0 - 15.0 g/dL 86.7  87.4  86.6   Hematocrit 36.0 - 46.0 % 40.1  37.4  39.0   Platelets 150 - 400 K/uL 144  313  144     .    Latest Ref Rng & Units 08/04/2024   11:24 AM 06/02/2024   11:13 AM 04/22/2024   10:24 AM  CMP  Glucose 70 - 99 mg/dL 95  871  885   BUN 8 - 23 mg/dL 6  7  6    Creatinine 0.44 - 1.00 mg/dL 9.33  9.29  9.27   Sodium 135 - 145 mmol/L 144  140  141   Potassium 3.5 - 5.1 mmol/L 3.8  3.9  3.5   Chloride 98  - 111 mmol/L 108  103  106   CO2 22 - 32 mmol/L 31  30  30    Calcium 8.9 - 10.3 mg/dL 9.1  9.2  9.3   Total Protein 6.5 - 8.1 g/dL 6.6  6.8  6.9   Total Bilirubin 0.0 - 1.2 mg/dL 0.5  0.4  0.5   Alkaline Phos 38 - 126 U/L 62  78  58   AST 15 - 41 U/L 23  29  21    ALT 0 - 44 U/L 14  27  14       RADIOGRAPHIC STUDIES: I have personally reviewed the radiological images as listed and agreed with the findings in the report. No results found.   ASSESSMENT & PLAN:   78 y.o. female with:  Mantle cell lymphoma- newly diagnosed. Found as mass in the sigmoid colon on colonoscopy done to evaluate for Fhx of colon cancer and chronic IBS like symptoms. IBS Fhx of breast and colon cancer.  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 No notable toxicities from her last dose of maintenance Rituxan . No infection issues. Patient is appropriate to proceed with her next cycle of maintenance Rituxan  with the same supportive medications. She will follow-up with her gastroenterologist for her planned colonoscopy in October 2025. She was recommended to follow-up with her PCP for age-appropriate cancer screening and vaccinations.  FOLLOW-UP: Plz schedule next 2 cycles of maintenance Rituximab  q60 days per integrated scheduling  The total time spent in the appointment was 30 minutes*.  All of the patient's questions were answered with apparent satisfaction. The patient  knows to call the clinic with any problems, questions or concerns.   Emaline Saran MD MS AAHIVMS [facility] Hematology/Oncology Physician [facility]  .*Total Encounter Time as defined by the Centers for Medicare  and Medicaid Services includes, in addition to the face-to-face time of a patient visit (documented in the note above) non-face-to-face time: obtaining and reviewing outside history, ordering and reviewing medications, tests or procedures, care coordination (communications with other health care professionals or caregivers) and documentation in the medical record.

## 2024-08-21 ENCOUNTER — Other Ambulatory Visit: Payer: Self-pay

## 2024-08-28 ENCOUNTER — Other Ambulatory Visit: Payer: Self-pay

## 2024-09-29 ENCOUNTER — Inpatient Hospital Stay

## 2024-09-29 ENCOUNTER — Inpatient Hospital Stay: Admitting: Hematology

## 2024-09-29 ENCOUNTER — Inpatient Hospital Stay: Attending: Hematology

## 2024-09-29 ENCOUNTER — Encounter: Payer: Self-pay | Admitting: Hematology

## 2024-09-29 VITALS — BP 163/80 | HR 75 | Temp 98.5°F | Resp 16 | Ht 66.0 in

## 2024-09-29 VITALS — BP 162/86 | HR 91 | Temp 97.7°F | Resp 18 | Wt 125.9 lb

## 2024-09-29 DIAGNOSIS — Z79899 Other long term (current) drug therapy: Secondary | ICD-10-CM | POA: Diagnosis not present

## 2024-09-29 DIAGNOSIS — G609 Hereditary and idiopathic neuropathy, unspecified: Secondary | ICD-10-CM | POA: Insufficient documentation

## 2024-09-29 DIAGNOSIS — Z5111 Encounter for antineoplastic chemotherapy: Secondary | ICD-10-CM

## 2024-09-29 DIAGNOSIS — Z800 Family history of malignant neoplasm of digestive organs: Secondary | ICD-10-CM | POA: Diagnosis not present

## 2024-09-29 DIAGNOSIS — K589 Irritable bowel syndrome without diarrhea: Secondary | ICD-10-CM | POA: Insufficient documentation

## 2024-09-29 DIAGNOSIS — F418 Other specified anxiety disorders: Secondary | ICD-10-CM | POA: Insufficient documentation

## 2024-09-29 DIAGNOSIS — Z5112 Encounter for antineoplastic immunotherapy: Secondary | ICD-10-CM | POA: Insufficient documentation

## 2024-09-29 DIAGNOSIS — C8310 Mantle cell lymphoma, unspecified site: Secondary | ICD-10-CM | POA: Diagnosis not present

## 2024-09-29 DIAGNOSIS — C8319 Mantle cell lymphoma, extranodal and solid organ sites: Secondary | ICD-10-CM | POA: Insufficient documentation

## 2024-09-29 DIAGNOSIS — Z803 Family history of malignant neoplasm of breast: Secondary | ICD-10-CM | POA: Insufficient documentation

## 2024-09-29 LAB — CBC WITH DIFFERENTIAL (CANCER CENTER ONLY)
Abs Immature Granulocytes: 0.01 K/uL (ref 0.00–0.07)
Basophils Absolute: 0 K/uL (ref 0.0–0.1)
Basophils Relative: 1 %
Eosinophils Absolute: 0.1 K/uL (ref 0.0–0.5)
Eosinophils Relative: 1 %
HCT: 39.9 % (ref 36.0–46.0)
Hemoglobin: 13.3 g/dL (ref 12.0–15.0)
Immature Granulocytes: 0 %
Lymphocytes Relative: 7 %
Lymphs Abs: 0.3 K/uL — ABNORMAL LOW (ref 0.7–4.0)
MCH: 30.7 pg (ref 26.0–34.0)
MCHC: 33.3 g/dL (ref 30.0–36.0)
MCV: 92.1 fL (ref 80.0–100.0)
Monocytes Absolute: 0.5 K/uL (ref 0.1–1.0)
Monocytes Relative: 9 %
Neutro Abs: 4 K/uL (ref 1.7–7.7)
Neutrophils Relative %: 82 %
Platelet Count: 165 K/uL (ref 150–400)
RBC: 4.33 MIL/uL (ref 3.87–5.11)
RDW: 14.6 % (ref 11.5–15.5)
WBC Count: 4.9 K/uL (ref 4.0–10.5)
nRBC: 0 % (ref 0.0–0.2)

## 2024-09-29 LAB — CMP (CANCER CENTER ONLY)
ALT: 16 U/L (ref 0–44)
AST: 23 U/L (ref 15–41)
Albumin: 4 g/dL (ref 3.5–5.0)
Alkaline Phosphatase: 69 U/L (ref 38–126)
Anion gap: 4 — ABNORMAL LOW (ref 5–15)
BUN: 7 mg/dL — ABNORMAL LOW (ref 8–23)
CO2: 34 mmol/L — ABNORMAL HIGH (ref 22–32)
Calcium: 9.3 mg/dL (ref 8.9–10.3)
Chloride: 106 mmol/L (ref 98–111)
Creatinine: 0.73 mg/dL (ref 0.44–1.00)
GFR, Estimated: >60 mL/min (ref >60)
Glucose, Bld: 75 mg/dL (ref 70–99)
Potassium: 4.1 mmol/L (ref 3.5–5.1)
Sodium: 144 mmol/L (ref 135–145)
Total Bilirubin: 0.4 mg/dL (ref 0.0–1.2)
Total Protein: 6.4 g/dL — ABNORMAL LOW (ref 6.5–8.1)

## 2024-09-29 MED ORDER — DEXAMETHASONE SODIUM PHOSPHATE 10 MG/ML IJ SOLN
10.0000 mg | Freq: Once | INTRAMUSCULAR | Status: AC
  Administered 2024-09-29: 10 mg via INTRAVENOUS

## 2024-09-29 MED ORDER — SODIUM CHLORIDE 0.9 % IV SOLN
375.0000 mg/m2 | Freq: Once | INTRAVENOUS | Status: AC
  Administered 2024-09-29: 600 mg via INTRAVENOUS
  Filled 2024-09-29: qty 50

## 2024-09-29 MED ORDER — DIPHENHYDRAMINE HCL 25 MG PO CAPS
25.0000 mg | ORAL_CAPSULE | Freq: Once | ORAL | Status: AC
  Administered 2024-09-29: 25 mg via ORAL
  Filled 2024-09-29: qty 1

## 2024-09-29 MED ORDER — SODIUM CHLORIDE 0.9 % IV SOLN: INTRAVENOUS | Status: DC

## 2024-09-29 MED ORDER — ACETAMINOPHEN 325 MG PO TABS
650.0000 mg | ORAL_TABLET | Freq: Once | ORAL | Status: AC
  Administered 2024-09-29: 650 mg via ORAL
  Filled 2024-09-29: qty 2

## 2024-09-29 NOTE — Progress Notes (Signed)
 Confirmed w/ Dr Onesimo to use today's wt/BSA for RTX dose.  Future orders adjusted for updated weight.  Okay to round down to 600mg  per Dr Onesimo.  Kelbie Moro, Pharm.D., CPP 09/29/2024@1 :40 PM

## 2024-09-29 NOTE — Progress Notes (Signed)
 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  HISTORY OF PRESENTING ILLNESS: Courtney House is a wonderful 78 y.o. female who has been referred to us  by Courtney Claw, MD for evaluation and management of Mantle cell lymphoma.    Patient reports having a history of IBS for several years with no recent symptoms. Her bowel symptoms, including diarrhea and abdominal cramping, have been stable and managed with Hyoscyamine.     She reports stable lack of appetite over the last few years. She notes that her weight fluctuates but denies any significant weight loss.    She denies any history of polyps in the past prior to her recent routine colonoscopy with Dr. Elicia which showed multiple polyps which were noted to be tubular adenomas, however the mass in sigmoid colon which was reported to look like a lipoma on colonoscopy on biopsy turned out to show a mantle cell lymphoma.  Her previous colonoscopy was likely about 5-6 years ago, though she admits her timeline may be inaccurate.   Patient has stable bilateral leg swelling and occasional lower back pain. She denies any sudden weight loss, fever, chills, night sweats, new lumps/bumps, or swallowing issues.    She reports occasional episodes of feeling flushed with itchiness and redness in her face/head. These episodes have been occurring since a year ago. She does not tend to get acid reflux.   Patient denies any heart, lung, liver, or kidney issues. Patient is able to stay active with no significant physical limitations. She does note having neuropathy in her bilateral lower extremities, which she controls with Tramadol and Gabapentin , managed by neurology.   Patient is a never smoker and denies any alcohol use.   In regards to her  fhx, she reports that her sister has breast and colon cancer. Also, her mother has a history of brain tumor, her father has CHF, her brother has throat cancer, and her maternal uncle was diagnosed with mouth cancer and was a frequent smoker. She notes that her brother was a smoker, but likely did not have heavy use.   SUMMARY OF ONCOLOGIC HISTORY: Oncology History  Mantle cell lymphoma (HCC)  08/24/2023 Initial Diagnosis   Mantle cell lymphoma (HCC)   08/30/2023 -  Chemotherapy   Patient is on Treatment Plan : NON-HODGKINS LYMPHOMA Rituximab  D1 + Bendamustine  D1,2 q28d x 6 cycles     On Maintenance Rituximab  every 60 days  INTERVAL HISTORY:  Courtney House is a 78 y.o. 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   she was last seen by me on 08/04/2024; at the time she mentioned experiencing chronic alternating diarrhea and IBS-like symptoms, but was otherwise doing well without complaints.   Today, she states she is feeling well overall.   She notes that she struggles pushing a full grocery cart at the grocery store. She also states that she does not have much energy lately, but does try to go on a short walk around her house for exercise. 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   She states that she is trying to eat three complete meals a day. She is trying to remember to take her multivitamins daily.  Denies any  bowel changes, fevers/chills, new lumps/bumps, or any unexpected weight changes.   She is not up to date on her influenza shot or COVID vaccine.  She is unsure if she has received her RSV vaccine. She states that she has not had a vaccine in the last four years.    REVIEW OF SYSTEMS:   10 Point review of systems of done and is negative except as noted above.  MEDICAL HISTORY Past Medical History:  Diagnosis Date   Anxiety    Carpal tunnel  syndrome    Depression    Hypercholesterolemia    IBS (irritable bowel syndrome)    Insomnia    Sensory disease or syndrome 05/05/2014    SURGICAL HISTORY Past Surgical History:  Procedure Laterality Date   CARPAL TUNNEL RELEASE Right    TUBAL LIGATION  1974    SOCIAL HISTORY Social History   Tobacco Use   Smoking status: Never   Smokeless tobacco: Never  Substance Use Topics   Alcohol use: No    Alcohol/week: 0.0 standard drinks of alcohol   Drug use: No    Social History   Social History Narrative   Patient is widowed and her daughter and grandson lives with her.   Patient does drink one glass of tea daily.   Patient works on an  theatre stage manager.   Patient has a high school education.   Patient has two adult children.   Patient is right-handed.                   SOCIAL DRIVERS OF HEALTH SDOH Screenings   Depression [phone]): Low Risk  (08/04/2024)  Tobacco Use: Low Risk  (01/05/2020)     FAMILY HISTORY Family History  Problem Relation Age of Onset   Breast cancer Sister        lymph nodes   Parkinson's disease Brother    Throat cancer Brother    Macular degeneration Brother      ALLERGIES: has no known allergies.  MEDICATIONS  Current Outpatient Medications  Medication Sig Dispense Refill   b complex vitamins capsule Take 1 capsule by mouth daily.     calcium gluconate 650 MG tablet Take 650 mg by mouth daily.     cholecalciferol (VITAMIN D ) 1000 UNITS tablet Take 1,000 Units by mouth daily.     gabapentin  (NEURONTIN ) 400 MG capsule TAKE ONE CAPSULE BY MOUTH IN THE MORNING AND TAKE 1 CAPSULE IN EVENING AND TAKE 2 CAPSULES AT BEDTIME 120 capsule 0   hyoscyamine (LEVSIN, ANASPAZ) 0.125 MG tablet Take 0.125 mg by mouth every 4 (four) hours as needed (abdominal pain).      ondansetron  (ZOFRAN ) 8 MG tablet Take 1 tablet (8 mg total) by mouth every 8 (eight) hours as needed for nausea or vomiting. Start on the third day after chemotherapy. 30 tablet 1    Prenatal Vit-Fe Fumarate-FA (PRENATAL VITAMINS) 28-0.8 MG TABS Take 28 mg by mouth 1 day or 1 dose. (Patient taking differently: Take 28 mg by mouth daily.) 30 tablet 5   prochlorperazine  (COMPAZINE ) 10 MG tablet Take 1 tablet (10 mg total) by mouth every 6 (six) hours as needed for nausea or vomiting. 30 tablet 1   temazepam (RESTORIL) 15 MG capsule Take 15 mg by mouth at bedtime as needed for sleep.     traMADol (ULTRAM) 50 MG tablet Take 50 mg by mouth 2 (two) times daily. 50mg  in am and 50mg  in pm     No current facility-administered medications  for this visit.   Facility-Administered Medications Ordered in Other Visits  Medication Dose Route Frequency Provider Last Rate Last Admin   0.9 %  sodium chloride  infusion   Intravenous Continuous Onesimo Emaline Brink, MD 200 mL/hr at 11/21/23 1631 Infusion Verify at 11/21/23 1631    PHYSICAL EXAMINATION: ECOG PERFORMANCE STATUS: 1 - Symptomatic but completely ambulatory VITALS: Vitals:   09/29/24 1220 09/29/24 1226  BP: (!) 171/82 (!) 162/86  Pulse: 91   Resp: 18   Temp: 97.7 F (36.5 C)   SpO2: 99%    Filed Weights   09/29/24 1220  Weight: 125 lb 14.4 oz (57.1 kg)   Body mass index is 20.32 kg/m.  GENERAL: alert, in no acute distress and comfortable SKIN: no acute rashes, no significant lesions EYES: conjunctiva are pink and non-injected, sclera anicteric OROPHARYNX: MMM, no exudates, no oropharyngeal erythema or ulceration NECK: supple, no JVD LYMPH:  no palpable lymphadenopathy in the cervical, axillary or inguinal regions LUNGS: clear to auscultation b/l with normal respiratory effort HEART: regular rate & rhythm ABDOMEN:  normoactive bowel sounds , non tender, not distended, no hepatosplenomegaly Extremity: no pedal edema PSYCH: alert & oriented x 3 with fluent speech NEURO: no focal motor/sensory deficits  LABORATORY DATA:   I have reviewed the data as listed     Latest Ref Rng & Units 09/29/2024   11:47 AM  08/04/2024   11:24 AM 06/02/2024   11:13 AM  CBC EXTENDED  WBC 4.0 - 10.5 K/uL 4.9  5.1  10.6   RBC 3.87 - 5.11 MIL/uL 4.33  4.33  4.11   Hemoglobin 12.0 - 15.0 g/dL 86.6  86.7  87.4   HCT 36.0 - 46.0 % 39.9  40.1  37.4   Platelets 150 - 400 K/uL 165  144  313   NEUT# 1.7 - 7.7 K/uL 4.0  4.3  9.4   Lymph# 0.7 - 4.0 K/uL 0.3  0.2  0.2        Latest Ref Rng & Units 09/29/2024   11:47 AM 08/04/2024   11:24 AM 06/02/2024   11:13 AM  CMP  Glucose 70 - 99 mg/dL 75  95  871   BUN 8 - 23 mg/dL 7  6  7    Creatinine 0.44 - 1.00 mg/dL 9.26  9.33  9.29   Sodium 135 - 145 mmol/L 144  144  140   Potassium 3.5 - 5.1 mmol/L 4.1  3.8  3.9   Chloride 98 - 111 mmol/L 106  108  103   CO2 22 - 32 mmol/L 34  31  30   Calcium 8.9 - 10.3 mg/dL 9.3  9.1  9.2   Total Protein 6.5 - 8.1 g/dL 6.4  6.6  6.8   Total Bilirubin 0.0 - 1.2 mg/dL 0.4  0.5  0.4   Alkaline Phos 38 - 126 U/L 69  62  78   AST 15 - 41 U/L 23  23  29    ALT 0 - 44 U/L 16  14  27         RADIOGRAPHIC STUDIES: I have personally reviewed the radiological images as listed and agreed with the findings in the report. No results found.   ASSESSMENT & PLAN:   78 y.o. female with  Mantle cell lymphoma- newly diagnosed. Found as mass in the sigmoid colon on colonoscopy done to evaluate for Fhx of colon cancer and chronic IBS like symptoms.  IBS  Fhx of breast and colon cancer.  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 Maintain nutrition and exercise. Patient has no evidence mantle cell lymphoma recurrence/progression at this time. -she notes that her colonoscopy with Dr Courtney showed no evidence of MCL. Switching maintenance rituximab  treatments to every 90 days from her next treatment. -counseled on staying up to  date with vaccinations  FOLLOW-UP schedule the next 2 treatments every 90 days with labs and MD visits.  The total time spent in the appointment was 30 minutes* .  All of the patient's questions were answered and the patient knows to call the clinic with any problems, questions, or concerns.  Emaline Saran MD MS AAHIVMS [facility] Hematology/Oncology Physician [facility]  *Total Encounter Time as defined by the Centers for Medicare and Medicaid Services includes, in addition to the face-to-face time of a patient visit (documented in the note above) non-face-to-face time: obtaining and reviewing outside history, ordering and reviewing medications, tests or procedures, care coordination (communications with other health care professionals or caregivers) and documentation in the medical record.  I,Emily Lagle,acting as a neurosurgeon for Emaline Saran, MD.,have documented all relevant documentation on the behalf of Emaline Saran, MD,as directed by  Emaline Saran, MD while in the presence of Emaline Saran, MD.  I have reviewed the above documentation for accuracy and completeness, and I agree with the above.  Alannie Amodio, MD

## 2024-09-29 NOTE — Progress Notes (Signed)
 Nutrition Follow-up:  Patient with mantle cell lymphoma followed by Dr Onesimo.  Patient receiving rituxamab.    Met with patient during infusion.  Reports that she still does not have much of an appetite.  For breakfast has yogurt or cereal or sausage biscuit with juice and grits.  Lunch is turkey sandwich with baked chips and jello with fruit.  Supper is meat and couple of sides.  Reports that a lady at her church makes her supper meals.  Drinks Fairlife shakes but has not drank them everyday.  I know I need to drink them.      Medications: reviewed  Labs: reviewed  Anthropometrics:   Weight 125 lb 14.4 oz today  126 lb on 9/22 144 lb on 07/03/23   NUTRITION DIAGNOSIS: Unintentional weight loss stable    INTERVENTION:  Encouraged Fairlife shake at least daily.      MONITORING, EVALUATION, GOAL: weight trends, intake   NEXT VISIT: as needed  Wonder Donaway B. Dasie SOLON, CSO, LDN Registered Dietitian [phone]

## 2024-09-29 NOTE — Patient Instructions (Signed)
 [facility] - A DEPT OF [facility]  Discharge Instructions: Thank you for choosing [facility] to provide your oncology and hematology care.   If you have a lab appointment with the Cancer Center, please go directly to the Cancer Center and check in at the registration area.   Wear comfortable clothing and clothing appropriate for easy access to any Portacath or PICC line.   We strive to give you quality time with your provider. You may need to reschedule your appointment if you arrive late (15 or more minutes).  Arriving late affects you and other patients whose appointments are after yours.  Also, if you miss three or more appointments without notifying the office, you may be dismissed from the clinic at the provider's discretion.      For prescription refill requests, have your pharmacy contact our office and allow 72 hours for refills to be completed.    Today you received the following chemotherapy and/or immunotherapy agent: Rituximab      To help prevent nausea and vomiting after your treatment, we encourage you to take your nausea medication as directed.  BELOW ARE SYMPTOMS THAT SHOULD BE REPORTED IMMEDIATELY: *FEVER GREATER THAN 100.4 F (38 C) OR HIGHER *CHILLS OR SWEATING *NAUSEA AND VOMITING THAT IS NOT CONTROLLED WITH YOUR NAUSEA MEDICATION *UNUSUAL SHORTNESS OF BREATH *UNUSUAL BRUISING OR BLEEDING *URINARY PROBLEMS (pain or burning when urinating, or frequent urination) *BOWEL PROBLEMS (unusual diarrhea, constipation, pain near the anus) TENDERNESS IN MOUTH AND THROAT WITH OR WITHOUT PRESENCE OF ULCERS (sore throat, sores in mouth, or a toothache) UNUSUAL RASH, SWELLING OR PAIN  UNUSUAL VAGINAL DISCHARGE OR ITCHING   Items with * indicate a potential emergency and should be followed up as soon as possible or go to the Emergency Department if any problems should occur.  Please show the CHEMOTHERAPY ALERT CARD or IMMUNOTHERAPY  ALERT CARD at check-in to the Emergency Department and triage nurse.  Should you have questions after your visit or need to cancel or reschedule your appointment, please contact [facility] - A DEPT OF [facility]  Dept: [phone]  and follow the prompts.  Office hours are 8:00 a.m. to 4:30 p.m. Monday - Friday. Please note that voicemails left after 4:00 p.m. may not be returned until the following business day.  We are closed weekends and major holidays. You have access to a nurse at all times for urgent questions. Please call the main number to the clinic Dept: [phone] and follow the prompts.   For any non-urgent questions, you may also contact your provider using MyChart. We now offer e-Visits for anyone 31 and older to request care online for non-urgent symptoms. For details visit [email or web address].   Also download the MyChart app! Go to the app store, search "MyChart", open the app, select [place], and log in with your MyChart username and password.

## 2024-10-06 ENCOUNTER — Encounter: Payer: Self-pay | Admitting: Hematology

## 2024-11-16 ENCOUNTER — Other Ambulatory Visit: Payer: Self-pay

## 2024-12-29 ENCOUNTER — Inpatient Hospital Stay: Admitting: Hematology

## 2024-12-29 ENCOUNTER — Inpatient Hospital Stay
